# Patient Record
Sex: Female | Born: 1935 | Race: White | Hispanic: No | Marital: Married | Smoking: Never smoker
Health system: Southern US, Community
[De-identification: ages and names within clinical notes are randomized; demographics above are authoritative.]

## PROBLEM LIST (undated history)

## (undated) DIAGNOSIS — I509 Heart failure, unspecified: Secondary | ICD-10-CM

## (undated) DIAGNOSIS — C679 Malignant neoplasm of bladder, unspecified: Secondary | ICD-10-CM

## (undated) DIAGNOSIS — I495 Sick sinus syndrome: Secondary | ICD-10-CM

## (undated) DIAGNOSIS — Z951 Presence of aortocoronary bypass graft: Secondary | ICD-10-CM

## (undated) DIAGNOSIS — Z950 Presence of cardiac pacemaker: Secondary | ICD-10-CM

## (undated) DIAGNOSIS — R55 Syncope and collapse: Secondary | ICD-10-CM

## (undated) DIAGNOSIS — I219 Acute myocardial infarction, unspecified: Secondary | ICD-10-CM

## (undated) DIAGNOSIS — I639 Cerebral infarction, unspecified: Secondary | ICD-10-CM

## (undated) DIAGNOSIS — I10 Essential (primary) hypertension: Secondary | ICD-10-CM

## (undated) DIAGNOSIS — Z5189 Encounter for other specified aftercare: Secondary | ICD-10-CM

## (undated) DIAGNOSIS — H269 Unspecified cataract: Secondary | ICD-10-CM

## (undated) DIAGNOSIS — I2510 Atherosclerotic heart disease of native coronary artery without angina pectoris: Secondary | ICD-10-CM

## (undated) DIAGNOSIS — I480 Paroxysmal atrial fibrillation: Secondary | ICD-10-CM

## (undated) DIAGNOSIS — G459 Transient cerebral ischemic attack, unspecified: Secondary | ICD-10-CM

## (undated) DIAGNOSIS — E119 Type 2 diabetes mellitus without complications: Secondary | ICD-10-CM

## (undated) DIAGNOSIS — F419 Anxiety disorder, unspecified: Secondary | ICD-10-CM

## (undated) DIAGNOSIS — E785 Hyperlipidemia, unspecified: Secondary | ICD-10-CM

## (undated) DIAGNOSIS — N183 Chronic kidney disease, stage 3 (moderate): Secondary | ICD-10-CM

## (undated) DIAGNOSIS — M810 Age-related osteoporosis without current pathological fracture: Secondary | ICD-10-CM

## (undated) DIAGNOSIS — E079 Disorder of thyroid, unspecified: Secondary | ICD-10-CM

## (undated) HISTORY — PX: TRANSURETHRAL RESECTION OF BLADDER: SUR1395

## (undated) HISTORY — DX: Disorder of thyroid, unspecified: E07.9

## (undated) HISTORY — PX: NEPHROURETERECTOMY: SUR876

## (undated) HISTORY — DX: Anxiety disorder, unspecified: F41.9

## (undated) HISTORY — PX: IR RETROGRADE PYELOGRAM: IMG2374

## (undated) HISTORY — PX: CAROTID ENDARTERECTOMY: SUR193

## (undated) HISTORY — DX: Encounter for other specified aftercare: Z51.89

## (undated) HISTORY — PX: CORONARY ARTERY BYPASS GRAFT: SHX141

## (undated) HISTORY — DX: Cerebral infarction, unspecified: I63.9

## (undated) HISTORY — DX: Acute myocardial infarction, unspecified: I21.9

## (undated) HISTORY — DX: Paroxysmal atrial fibrillation: I48.0

## (undated) HISTORY — DX: Heart failure, unspecified: I50.9

## (undated) HISTORY — DX: Malignant neoplasm of bladder, unspecified: C67.9

## (undated) HISTORY — DX: Unspecified cataract: H26.9

## (undated) HISTORY — DX: Transient cerebral ischemic attack, unspecified: G45.9

## (undated) HISTORY — DX: Presence of aortocoronary bypass graft: Z95.1

## (undated) HISTORY — DX: Type 2 diabetes mellitus without complications: E11.9

## (undated) HISTORY — DX: Age-related osteoporosis without current pathological fracture: M81.0

## (undated) HISTORY — DX: Sick sinus syndrome: I49.5

## (undated) HISTORY — DX: Hyperlipidemia, unspecified: E78.5

## (undated) HISTORY — PX: EYE SURGERY: SHX253

## (undated) HISTORY — DX: Chronic kidney disease, stage 3 (moderate): N18.3

## (undated) HISTORY — DX: Essential (primary) hypertension: I10

## (undated) HISTORY — DX: Atherosclerotic heart disease of native coronary artery without angina pectoris: I25.10

## (undated) HISTORY — PX: BLADDER SURGERY: SHX569

## (undated) HISTORY — PX: BACK SURGERY: SHX140

## (undated) HISTORY — PX: INSERT / REPLACE / REMOVE PACEMAKER: SUR710

## (undated) HISTORY — DX: Presence of cardiac pacemaker: Z95.0

---
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
## 2000-05-03 ENCOUNTER — Encounter: Payer: Self-pay | Admitting: Family Medicine

## 2000-05-03 ENCOUNTER — Encounter: Admission: RE | Admit: 2000-05-03 | Discharge: 2000-05-03 | Payer: Self-pay | Admitting: Family Medicine

## 2001-06-27 ENCOUNTER — Encounter: Payer: Self-pay | Admitting: Family Medicine

## 2001-06-27 ENCOUNTER — Encounter: Admission: RE | Admit: 2001-06-27 | Discharge: 2001-06-27 | Payer: Self-pay | Admitting: Family Medicine

## 2001-07-01 ENCOUNTER — Other Ambulatory Visit: Admission: RE | Admit: 2001-07-01 | Discharge: 2001-07-01 | Payer: Self-pay | Admitting: Family Medicine

## 2006-10-21 ENCOUNTER — Ambulatory Visit: Payer: Self-pay | Admitting: Vascular Surgery

## 2006-10-24 ENCOUNTER — Inpatient Hospital Stay (HOSPITAL_COMMUNITY): Admission: RE | Admit: 2006-10-24 | Discharge: 2006-10-25 | Payer: Self-pay | Admitting: Vascular Surgery

## 2006-10-24 ENCOUNTER — Ambulatory Visit: Payer: Self-pay | Admitting: Vascular Surgery

## 2006-10-24 ENCOUNTER — Encounter (INDEPENDENT_AMBULATORY_CARE_PROVIDER_SITE_OTHER): Payer: Self-pay | Admitting: Specialist

## 2006-11-11 ENCOUNTER — Ambulatory Visit: Payer: Self-pay | Admitting: Vascular Surgery

## 2007-05-16 ENCOUNTER — Ambulatory Visit: Payer: Self-pay | Admitting: Vascular Surgery

## 2007-06-02 ENCOUNTER — Inpatient Hospital Stay (HOSPITAL_COMMUNITY): Admission: RE | Admit: 2007-06-02 | Discharge: 2007-06-03 | Payer: Self-pay | Admitting: Vascular Surgery

## 2007-06-02 ENCOUNTER — Ambulatory Visit: Payer: Self-pay | Admitting: Vascular Surgery

## 2007-06-02 ENCOUNTER — Encounter: Payer: Self-pay | Admitting: Vascular Surgery

## 2007-06-13 ENCOUNTER — Ambulatory Visit: Payer: Self-pay | Admitting: Vascular Surgery

## 2007-11-28 ENCOUNTER — Ambulatory Visit: Payer: Self-pay | Admitting: Vascular Surgery

## 2008-10-26 ENCOUNTER — Ambulatory Visit: Payer: Self-pay | Admitting: Thoracic Surgery (Cardiothoracic Vascular Surgery)

## 2008-12-08 ENCOUNTER — Ambulatory Visit: Payer: Self-pay | Admitting: Thoracic Surgery (Cardiothoracic Vascular Surgery)

## 2010-11-07 NOTE — Procedures (Signed)
CAROTID DUPLEX EXAM   INDICATION:  Follow up known carotid artery disease.   HISTORY:  Diabetes:  No.  Cardiac:  No.  Hypertension:  No.  Smoking:  No.  Previous Surgery:  CV History:  Amaurosis Fugax  No, Paresthesias No , Hemiparesis No                                       RIGHT             LEFT  Brachial systolic pressure:         110               120  Brachial Doppler waveforms:         Biphasic          Biphasic  Vertebral direction of flow:        Not well visualized                 Antegrade  DUPLEX VELOCITIES (cm/sec)  CCA peak systolic                   108               98  ECA peak systolic                   166               264  ICA peak systolic                   69                448  ICA end diastolic                   23                137  PLAQUE MORPHOLOGY:                  None              Heterogenous  PLAQUE AMOUNT:                      None              Severe  PLAQUE LOCATION:                    ECA               ICA, ECA   IMPRESSION:  1. 80-99% stenosis noted in the left internal carotid artery.  2. Normal carotid duplex noted in the right internal carotid artery.      Status post right carotid endarterectomy.  3. Antegrade left vertebral artery.   ___________________________________________  Larina Earthly, M.D.   MG/MEDQ  D:  05/16/2007  T:  05/17/2007  Job:  [id number]

## 2010-11-07 NOTE — Op Note (Signed)
Cassandra Castro, Cassandra Castro                 ACCOUNT NO.:  [id number]   MEDICAL RECORD NO.:  [id number]          PATIENT TYPE:  INP   LOCATION:  2899                         FACILITY:  [facility]   PHYSICIAN:  Larina Earthly, M.D.    DATE OF BIRTH:  03/23/1934   DATE OF PROCEDURE:  06/02/2007  DATE OF DISCHARGE:                               OPERATIVE REPORT   PREOPERATIVE DIAGNOSIS:  Severe asymptomatic left internal carotid  artery stenosis.   POSTOPERATIVE DIAGNOSIS:  Severe asymptomatic left internal carotid  artery stenosis.   PROCEDURE:  Left carotid endarterectomy with Dacron patch angioplasty.   SURGEON:  Larina Earthly, M.D.   ASSISTANT:  [name], PA-C.   ANESTHESIA:  General endotracheal.   COMPLICATIONS:  None.   DISPOSITION:  To recovery room, stable.   PROCEDURE IN DETAIL:  Patient was taken to the operating room and placed  supine.  The area over the left neck was prepped and draped in the usual  sterile fashion.  An incision was made anteriorly in the  sternocleidomastoid and carried through the tissue with electrocautery.  The sternocleidomastoid was reflected posteriorly, and the carotid  sheath was opened.  The facial vein was ligated with 2-0 silk ties and  divided.  The vagus and hypoglossal nerves were identified and  preserved.  Dissection was continued around the common carotid artery,  which was encircled with umbilical tape and Rumel tourniquet.  Dissection was continued on to the bifurcation.  The superior thyroid  artery was circled with a 2-0 silk and Pott's tie.  The external carotid  artery was circled with a blue vessel loop, and the internal carotid  artery was circled with umbilical tape and Rumel tourniquet.  The  patient was given 7000 units of intravenous heparin.  After adequate  circulation time, the internal, external, and common carotid arteries  were occluded.  The common carotid artery was opened with an 11 blade,  __________ into the  internal carotid artery.  A 10 shunt was passed up  the internal carotid, allowed to backbleed, and down the common carotid  artery, where it was secured with Rumel tourniquets.  The endarterectomy  was begun on the common carotid artery, and the plaque was divided  proximally with Pott's scissors.  The endarterectomy was extended onto  the bifurcation.  The external carotid was endarterectomized with an  eversion technique, and the internal carotid artery was  endarterectomized in an open fashion.  The remaining atheromatous debris  was removed from within the endarterectomy plane.  A Finesse Hemashield  Dacron patch was brought into the field and was sewn as a patch  angioplasty with a running 6-0 Prolene suture.  Prior to completion of  the anastomosis, the usual flushing maneuvers were undertaken, and the  shunt was removed.  The anastomosis was completed, and flow was restored  first to the external, then the internal carotid artery.  Excellent flow  characteristics were noted with hand-held Doppler in the internal and  external carotid arteries.  Patient was given 50 mg of protamine to  reverse heparin.  The wounds were irrigated with saline.  Hemostasis:  Electrocautery wounds were closed with 3-0 Vicryl.  We reapproximated  the sternocleidomastoid over  the carotid sheath.  Next, the platysma was closed with a running 3-0  Vicryl suture, and finally, the skin was closed with a 4-0 subcuticular  Vicryl stitch.  Sterile dressing was applied.  Patient was awakened in  the operating room neurologically intact and was transferred to the  recovery room in stable condition.      Larina Earthly, M.D.  Electronically Signed     TFE/MEDQ  D:  06/02/2007  T:  06/02/2007  Job:  [id number]

## 2010-11-07 NOTE — Procedures (Signed)
CAROTID DUPLEX EXAM   INDICATION:  Follow-up evaluation, status post bilateral carotid  endarterectomy.   HISTORY:  Diabetes:  No.  Cardiac:  No.  Hypertension:  No.  Smoking:  No.  Previous Surgery:  Right carotid endarterectomy with Dacron patch  angioplasty on 10/24/06.  Left carotid endarterectomy with Dacron patch  angioplasty on 06/02/07.  Both surgeries were performed by Dr. Arbie Cookey.  CV History:  Previous duplex (preop) on 05/16/07 revealed no right ICA  stenosis, status post endarterectomy and an 80-99% left ICA stenosis.  Amaurosis Fugax No, Paresthesias No, Hemiparesis No.  Patient reports  floater in the right eye for five days.                                       RIGHT             LEFT  Brachial systolic pressure:         136               140  Brachial Doppler waveforms:         Triphasic         Triphasic  Vertebral direction of flow:        Inaudible         Antegrade  DUPLEX VELOCITIES (cm/sec)  CCA peak systolic                   56                74  ECA peak systolic                   84                169  ICA peak systolic                   50                78  ICA end diastolic                   10                25  PLAQUE MORPHOLOGY:                  None              None  PLAQUE AMOUNT:                      None              None  PLAQUE LOCATION:                    None              None   IMPRESSION:  20-39% internal carotid artery stenosis, status post  endarterectomy bilaterally.   ___________________________________________  Larina Earthly, M.D.   MC/MEDQ  D:  11/28/2007  T:  11/28/2007  Job:  [id number]

## 2010-11-07 NOTE — Assessment & Plan Note (Signed)
OFFICE VISIT   Cassandra Castro, Cassandra Castro  DOB:  [birth year]                                       06/13/2007  AVWUJ#:[id number]   The patient presents today for follow up of her left carotid  endarterectomy on 12/08.  She did well and was discharged home on  postoperative day 1.  She continues to do well since her surgery.  She  does report 1 episode approximately 5 days following her surgery where  she sensed a rapid heart rate in the 105 to 110 range.  This has  resolved.  She has had no neurological deficit and no wound problems.  Her neck incision is well healed and she has no carotid bruits  bilaterally.  She is neurologically intact.  I am quite pleased with her  initial recovery.  She will return to her usual activities and we will  see her again in 6 months for a repeat carotid duplex.   Larina Earthly, M.D.  Electronically Signed   TFE/MEDQ  D:  06/13/2007  T:  06/16/2007  Job:  [id number]   cc:   Lacretia Leigh. Quintella Reichert, M.D.

## 2010-11-07 NOTE — H&P (Signed)
HISTORY AND PHYSICAL EXAMINATION   May 16, 2007   Re:  Cassandra Castro, Cassandra Castro                   DOB:  [birth year]   ADMISSION DIAGNOSIS:  Severe asymptomatic left internal carotid artery  stenosis.   HISTORY OF PRESENT ILLNESS:  Patient is a 77 year old white female  status post right carotid endarterectomy for asymptomatic carotid  disease on Oct 24, 2006.  She has had no difficulty since that time.  Specifically denied any amaurosis fugax, transient ischemic attack, or  stroke.  She was seen in my office for continued followup of known  asymptomatic disease on the left and has had marked progression to  severe stenosis in her left carotid system.   PAST MEDICAL HISTORY:  Elevated cholesterol, history of bladder cancer  with no recurrence.   SOCIAL HISTORY:  She is married with five children.  Does not smoke or  drink alcohol on a regular basis.   MEDICATIONS:  Pravachol, aspirin, Valium p.r.n., Altace, simvastatin.   PHYSICAL EXAMINATION:  A well-developed and well-nourished white female  appearing her stated age of 77.  Blood pressure is 130/62, pulse 64,  respirations 18.  She is grossly intact neurologically.  She does have  bilateral carotid bruits and a well-healed right carotid incision.  Radial pulses are 2+.  She has 2+ femoral pulses.  Heart:  Regular rate  and rhythm.  Abdomen is soft and nontender.  No masses.  Chest:  Clear  bilaterally.   IMPRESSION:  Severe asymptomatic left internal carotid artery stenosis  with progression by duplex examination.   PLAN:  Patient will be admitted on June 02, 2007 for left carotid  endarterectomy and Dacron patch angioplasty.  She understands the  procedure, including the potential of 1-2% risk of stroke with surgery.   Cassandra Castro, M.D.  Electronically Signed   TFE/MEDQ  D:  05/16/2007  T:  05/19/2007  Job:  [id number]   cc:   Quentin Mulling, MD

## 2010-11-07 NOTE — Discharge Summary (Signed)
Cassandra Castro, Cassandra Castro                 ACCOUNT NO.:  [id number]   MEDICAL RECORD NO.:  [id number]          PATIENT TYPE:  INP   LOCATION:  3303                         FACILITY:  [facility]   PHYSICIAN:  Cassandra Castro, M.D.    DATE OF BIRTH:  1934/06/20   DATE OF ADMISSION:  06/02/2007  DATE OF DISCHARGE:  06/03/2007                               DISCHARGE SUMMARY   ADMISSION DIAGNOSIS:  Severe left internal carotid artery stenosis,  asymptomatic.   DISCHARGE/SECONDARY DIAGNOSES:  1. Severe left internal carotid artery stenosis, asymptomatic status      post left carotid endarterectomy.  2. Postoperative hypotension, improved.  3. Hypokalemia, supplemented.  4. Mild postoperative acute blood loss anemia.  5. History of right carotid endarterectomy in May 2008.  6. Hypercholesterolemia.  7. History of bladder cancer with no recurrence.  8. History of atrial fibrillation.   ALLERGIES:  AUGMENTIN, SULFA, PHENERGAN.   PROCEDURES:  June 02, 2007:  Left carotid endarterectomy with Dacron  patch angioplasty by Dr. Gretta Began.   HISTORY:  Cassandra Castro is a 77 year old Caucasian female with history of  __________  status post right carotid endarterectomy for asymptomatic  disease on Oct 24, 2006.  She had no difficulty since that time.  She  specifically denied history of amaurosis fugax, transient ischemic  attack, or stroke.  She has continued to see Dr. Arbie Cookey at the [facility] office  for continued followup for left carotid stenosis and recently was found  to have marked progression on the left.  He recommended that she undergo  elective left carotid endarterectomy to reduce her risk for future  stroke.   HOSPITAL COURSE:  Cassandra Castro was electively admitted to [facility] on 06/02/2007.  She underwent the previously mentioned  procedure.  After a short stay in recovery unit, was transferred to step-  down unit, 3300, where she remained until discharge.  She had a  relatively uneventful  hospital course, although required dopamine and  fluid boluses for postoperative hypotension which had improved by  discharge.  She also required supplementation for hypokalemia with a  potassium of 3 at discharge.  Other labs showed sodium 132, chloride  102, CO2 25, blood glucose 96, BUN 9, creatinine 0.95, calcium 7.2,  white blood count 5.9, hemoglobin 9, hematocrit 25, and platelet count  142,000.  Morning of postoperative day 1, her vitals were stable.  Neurologically was intact.  Incision showed no evidence of hematoma.  Her diet was advanced.  She was able to void following Foley catheter  removal and mobilized without difficulty.  Pain was controlled on oral  medication and she was subsequently felt appropriate for discharge home  on postoperative day 1, June 03, 2007.  She was in stable condition  at discharge.   DISCHARGE MEDICATIONS:  1. Tylox 1-2 tablets p.o. q.6 hours p.r.n. pain.  2. Aspirin 81 mg p.o. daily.  3. Valium 10 mg p.o. nightly.  4. Multivitamin 1 p.o. daily.  5. Niacin 500 mg p.o. nightly.  6. Altace 2.5 mg p.o. daily.  7. Simvastatin 20 mg p.o. nightly.  DISCHARGE INSTRUCTIONS:  1. She may continue a heart-healthy diet.  2. May shower, clean her incisions in only soap and water.  3. Call if she develops fever greater than 101, redness or drainage      from her incision site, changes in her neurological status.  4. She is to avoid driving or heavy lifting for the next 2 weeks.  5. Can increase her activity as tolerated.  6. She is to follow up with Dr. Arbie Cookey at the [facility] office in      approximately 2 weeks.  Our office will contact her regarding      specific appointment date and time.      Cassandra Castro, P.A.      Cassandra Castro, M.D.  Electronically Signed    AWZ/MEDQ  D:  06/03/2007  T:  06/03/2007  Job:  [id number]   cc:   Tinnie Gens, M.D. [place]

## 2010-11-07 NOTE — Assessment & Plan Note (Signed)
OFFICE VISIT   Cassandra Castro, Cassandra Castro  DOB:  28-Aug-1933                                       11/28/2007  EAVWU#:[id number]   The patient presents today for continued followup of her staged  bilateral endarterectomies.  She is her usual animated self with no new  difficulties.  She specifically denies any amaurosis fugax, transient  ischemic attack, or stroke.  She is grossly intact neurologically.  Her  right endarterectomy was in May 2008 with left endarterectomy was  December 2008.   PHYSICAL EXAM:  Her carotid arteries are without bruits bilaterally.  She had a well-healed carotid incisions bilaterally without bruits.  She  had 2+ radial pulses bilaterally.  She underwent repeat duplex today and  this reveals widely patent endarterectomies bilaterally with no evidence  of stenosis.  I am quite pleased with her initial followup.  We will  continue to see her on a yearly basis with carotid duplex to rule out  intercurrent stenosis.  She will notify us should she develop any  neurologic deficits.   Larina Earthly, M.D.  Electronically Signed   TFE/MEDQ  D:  11/28/2007  T:  12/01/2007  Job:  [id number]   cc:   Quentin Mulling, MD

## 2010-11-10 NOTE — Discharge Summary (Signed)
Cassandra Castro, Cassandra Castro NO.:  [id number]   MEDICAL RECORD NO.:  [id number]          PATIENT TYPE:  INP   LOCATION:  3308                         FACILITY:  [facility]   PHYSICIAN:  Larina Earthly, M.D.    DATE OF BIRTH:  [birth year]   DATE OF ADMISSION:  10/24/2006  DATE OF DISCHARGE:  10/25/2006                               DISCHARGE SUMMARY   HISTORY OF THE PRESENT ILLNESS:  Ms. Bastone is a 77 year old female  followed by Dr. Arbie Cookey for severe asymptomatic right internal carotid  artery stenosis.  She has remained stable and was admitted this  hospitalization for elective carotid endarterectomy.   PAST MEDICAL HISTORY:  Does include the following:  1. Hypercholesterolemia.  2. History of bladder cancer without recurrence.  3. History of atrial fibrillation.   MEDICATIONS PRIOR TO ADMISSION:  1. Pravachol  2. Daily aspirin.  3. Valium 10 mg p.r.n. as needed for anxiety.   ALLERGIES:  Include AUGMENTIN and SULFA.   For family history, social history, review of systems and physical exam,  please see the history and physical done at the time of admission.   HOSPITAL COURSE:  The patient was admitted electively and taken to the  operating room on Oct 24, 2006, at which time she underwent a right  carotid endarterectomy.  She tolerated this procedure well and was taken  to the postanesthesia care unit in stable condition.   POSTOPERATIVE HOSPITAL COURSE:  The patient did quite well.  She  remained neurologically intact.  Incision was fine without evidence of  bleeding, hematoma or infection.  She progressed in a routine manner  regarding advancement in activities.  Her overall status was felt to be  acceptable for discharge on postoperative day #1.   MEDICATIONS ON DISCHARGE:  1. Multivitamin one daily.  2. Aspirin 81 mg daily.  3. Valium p.r.n. as needed previously.  4. Pravachol 20 mg nightly.  5. Lasix 40 mg daily for pain.  6. Tylox one q.4-6 h. as  needed.   FOLLOWUP:  Dr. Arbie Cookey on Nov 11, 2006 at 10 a.m.   FINAL DIAGNOSES:  1. Severe asymptomatic right internal carotid artery stenosis, now      status post carotid endarterectomy as described above.  2. Other diagnoses as previously listed per the history.      Rowe Clack, P.A.-C.      Larina Earthly, M.D.  Electronically Signed    WEG/MEDQ  D:  12/18/2006  T:  12/19/2006  Job:  [id number]

## 2010-11-10 NOTE — Op Note (Signed)
Cassandra Castro, Cassandra Castro                 ACCOUNT NO.:  [id number]   MEDICAL RECORD NO.:  [id number]          PATIENT TYPE:  INP   LOCATION:  2550                         FACILITY:  [facility]   PHYSICIAN:  Larina Earthly, M.D.    DATE OF BIRTH:  04-27-1934   DATE OF PROCEDURE:  10/24/2006  DATE OF DISCHARGE:                               OPERATIVE REPORT   PREOPERATIVE DIAGNOSIS:  Severe asymptomatic right internal carotid  stenosis.   POSTOPERATIVE DIAGNOSIS:  Severe asymptomatic right internal carotid  stenosis.   PROCEDURE:  Right carotid endarterectomy and Dacron patch angioplasty.   SURGEON:  Larina Earthly, M.D.   ASSISTANT:  Nurse.   ANESTHESIA:  General endotracheal.   COMPLICATIONS:  None.   DISPOSITION:  To recovery room neurologically intact.   DESCRIPTION OF PROCEDURE:  The patient was taken to the operating room  and placed in the supine position where the area of the right neck  prepped and draped in usual sterile fashion.  Incision was made anterior  sternocleidomastoid and carried down to the platysma with  electrocautery.  Sternocleidomastoid was reflected posteriorly and the  carotid sheath was opened.  The vagus and hypoglossal nerves were  identified and preserved.  The common carotid artery was encircled with  umbilical tape and Rumel tourniquet.  Dissection was extended on to the  bifurcation.  The superior thyroid artery was circled 2-0 silk Potts  tie.  The external carotid circled with blue Vesseloop.  The internal  carotid circled with umbilical tape and Rumel tourniquet.  The patient  was given 7000 units of intravenous heparin.  After adequate circulation  time, the internal, external and common carotid arteries were occluded.  The common carotid artery was opened with a 11 blade, extended  longitudinally with Potts scissors.  A 10 shunt was passed up the  internal carotid, allowed to back bleed, then down the common carotid  were it was secured with Rumel  tourniquets.  Endarterectomy was begun on  the common carotid artery and the plaques divided proximally with Potts  scissors.  Endarterectomy was extended on to bifurcation.  External  carotid was endarterectomized with eversion technique and the internal  carotid endarterectomized in open fashion.  Remaining atheromatous  debris was removed from the endarterectomy plane.  Finesse Hemashield  Dacron patch was brought into the field and sewn as a patch angioplasty  with a running 6-0 Prolene suture.  Prior to completion of the  anastomosis, the usual flushing maneuvers were undertaken.  Anastomosis  was then completed, the external followed by the common, finally the  internal carotid artery's occlusion clamp was removed.  Excellent flow  characteristics were noted with handheld Doppler in the internal and  external carotid arteries.  The patient was given 50 mg of protamine to  reverse the heparin.  Wounds were irrigated with saline.  Hemostasis  with electrocautery.  Wounds closed 3-0 Vicryl in the subcutaneous and  subcuticular tissue.  Benzoin and Steri-Strips were applied.      Larina Earthly, M.D.  Electronically Signed     TFE/MEDQ  D:  10/24/2006  T:  10/24/2006  Job:  [id number]

## 2011-04-02 LAB — CBC
HCT: 36.8
Hemoglobin: 12.8
Hemoglobin: 9 — ABNORMAL LOW
MCHC: 34.8
MCHC: 34.9
MCV: 89.3
MCV: 89.8
Platelets: 228
RBC: 4.13
RDW: 13.8
RDW: 14.1
WBC: 5.6

## 2011-04-02 LAB — BASIC METABOLIC PANEL
CO2: 25
Calcium: 7.2 — ABNORMAL LOW
Chloride: 102
Creatinine, Ser: 0.95
Glucose, Bld: 96

## 2011-04-02 LAB — COMPREHENSIVE METABOLIC PANEL
ALT: 22
BUN: 7
Calcium: 9.9
Creatinine, Ser: 0.73
Glucose, Bld: 95
Sodium: 138
Total Protein: 5.9 — ABNORMAL LOW

## 2011-04-02 LAB — PROTIME-INR
INR: 1
Prothrombin Time: 13.5

## 2011-04-02 LAB — TYPE AND SCREEN
ABO/RH(D): O POS
Antibody Screen: NEGATIVE

## 2011-04-02 LAB — URINALYSIS, ROUTINE W REFLEX MICROSCOPIC
Bilirubin Urine: NEGATIVE
Glucose, UA: NEGATIVE
Hgb urine dipstick: NEGATIVE
Protein, ur: NEGATIVE
Urobilinogen, UA: 0.2

## 2011-04-02 LAB — APTT: aPTT: 27

## 2012-12-24 ENCOUNTER — Telehealth: Payer: Self-pay

## 2012-12-24 NOTE — Telephone Encounter (Signed)
Pt's. Daughter called to report pt. had a severe headache yesterday.  At the time of the headache, it was noted she was a little confused.  Daughter states her mother has had "multiple health problems".  Denied that pt. C/o of any vision loss.  States has had some unsteadiness.  Speech clear.  Daughter thought pt. had a slight facial droop.  States pt's. confusion is better today, and pt. denies headache.  Today, states she took pt. in for urinalysis to rule-out UTI.   Advised daughter pt. may have had a stroke.  She should be evaluated either in the ER or by her PCP.  Encouraged daughter to call the PCP in the AM to have her evaluated. Advised her to go to the ER if symptoms worsen.  Daughter verb. understanding.

## 2015-01-26 ENCOUNTER — Other Ambulatory Visit: Payer: Self-pay

## 2015-02-14 DIAGNOSIS — Z951 Presence of aortocoronary bypass graft: Secondary | ICD-10-CM

## 2015-02-14 DIAGNOSIS — I10 Essential (primary) hypertension: Secondary | ICD-10-CM

## 2015-02-14 DIAGNOSIS — I480 Paroxysmal atrial fibrillation: Secondary | ICD-10-CM

## 2015-02-14 DIAGNOSIS — E785 Hyperlipidemia, unspecified: Secondary | ICD-10-CM

## 2015-02-14 DIAGNOSIS — I25119 Atherosclerotic heart disease of native coronary artery with unspecified angina pectoris: Secondary | ICD-10-CM | POA: Insufficient documentation

## 2015-02-14 DIAGNOSIS — E7800 Pure hypercholesterolemia, unspecified: Secondary | ICD-10-CM | POA: Insufficient documentation

## 2015-02-14 DIAGNOSIS — I2510 Atherosclerotic heart disease of native coronary artery without angina pectoris: Secondary | ICD-10-CM

## 2015-02-14 DIAGNOSIS — I119 Hypertensive heart disease without heart failure: Secondary | ICD-10-CM | POA: Insufficient documentation

## 2015-02-14 HISTORY — DX: Presence of aortocoronary bypass graft: Z95.1

## 2015-02-14 HISTORY — DX: Essential (primary) hypertension: I10

## 2015-02-14 HISTORY — DX: Hyperlipidemia, unspecified: E78.5

## 2015-02-14 HISTORY — DX: Atherosclerotic heart disease of native coronary artery without angina pectoris: I25.10

## 2015-02-14 HISTORY — DX: Paroxysmal atrial fibrillation: I48.0

## 2015-02-15 DIAGNOSIS — Z950 Presence of cardiac pacemaker: Secondary | ICD-10-CM | POA: Insufficient documentation

## 2015-02-15 HISTORY — DX: Presence of cardiac pacemaker: Z95.0

## 2015-02-18 DIAGNOSIS — C679 Malignant neoplasm of bladder, unspecified: Secondary | ICD-10-CM | POA: Insufficient documentation

## 2015-02-18 DIAGNOSIS — F419 Anxiety disorder, unspecified: Secondary | ICD-10-CM

## 2015-02-18 HISTORY — DX: Anxiety disorder, unspecified: F41.9

## 2015-02-18 HISTORY — DX: Malignant neoplasm of bladder, unspecified: C67.9

## 2015-12-01 DIAGNOSIS — I495 Sick sinus syndrome: Secondary | ICD-10-CM

## 2015-12-01 HISTORY — DX: Sick sinus syndrome: I49.5

## 2016-04-22 DIAGNOSIS — N183 Chronic kidney disease, stage 3 unspecified: Secondary | ICD-10-CM

## 2016-04-22 HISTORY — DX: Chronic kidney disease, stage 3 unspecified: N18.30

## 2017-01-25 DIAGNOSIS — I480 Paroxysmal atrial fibrillation: Secondary | ICD-10-CM | POA: Diagnosis not present

## 2017-01-25 DIAGNOSIS — N390 Urinary tract infection, site not specified: Secondary | ICD-10-CM

## 2017-01-25 DIAGNOSIS — S42301A Unspecified fracture of shaft of humerus, right arm, initial encounter for closed fracture: Secondary | ICD-10-CM

## 2017-01-25 DIAGNOSIS — R4182 Altered mental status, unspecified: Secondary | ICD-10-CM

## 2017-01-25 DIAGNOSIS — I2510 Atherosclerotic heart disease of native coronary artery without angina pectoris: Secondary | ICD-10-CM | POA: Diagnosis not present

## 2017-01-25 DIAGNOSIS — I10 Essential (primary) hypertension: Secondary | ICD-10-CM

## 2017-01-26 DIAGNOSIS — N390 Urinary tract infection, site not specified: Secondary | ICD-10-CM | POA: Diagnosis not present

## 2017-01-26 DIAGNOSIS — I10 Essential (primary) hypertension: Secondary | ICD-10-CM | POA: Diagnosis not present

## 2017-01-26 DIAGNOSIS — I2510 Atherosclerotic heart disease of native coronary artery without angina pectoris: Secondary | ICD-10-CM | POA: Diagnosis not present

## 2017-01-26 DIAGNOSIS — I4891 Unspecified atrial fibrillation: Secondary | ICD-10-CM

## 2017-01-26 DIAGNOSIS — I480 Paroxysmal atrial fibrillation: Secondary | ICD-10-CM | POA: Diagnosis not present

## 2017-01-27 DIAGNOSIS — I480 Paroxysmal atrial fibrillation: Secondary | ICD-10-CM | POA: Diagnosis not present

## 2017-01-27 DIAGNOSIS — N390 Urinary tract infection, site not specified: Secondary | ICD-10-CM | POA: Diagnosis not present

## 2017-01-27 DIAGNOSIS — I2510 Atherosclerotic heart disease of native coronary artery without angina pectoris: Secondary | ICD-10-CM | POA: Diagnosis not present

## 2017-01-27 DIAGNOSIS — I10 Essential (primary) hypertension: Secondary | ICD-10-CM | POA: Diagnosis not present

## 2017-01-28 DIAGNOSIS — E669 Obesity, unspecified: Secondary | ICD-10-CM

## 2017-01-28 DIAGNOSIS — G9341 Metabolic encephalopathy: Secondary | ICD-10-CM

## 2017-01-28 DIAGNOSIS — E785 Hyperlipidemia, unspecified: Secondary | ICD-10-CM

## 2017-01-28 DIAGNOSIS — I10 Essential (primary) hypertension: Secondary | ICD-10-CM | POA: Diagnosis not present

## 2017-01-28 DIAGNOSIS — I480 Paroxysmal atrial fibrillation: Secondary | ICD-10-CM | POA: Diagnosis not present

## 2017-01-28 DIAGNOSIS — S42301A Unspecified fracture of shaft of humerus, right arm, initial encounter for closed fracture: Secondary | ICD-10-CM | POA: Diagnosis not present

## 2017-01-29 DIAGNOSIS — E669 Obesity, unspecified: Secondary | ICD-10-CM | POA: Diagnosis not present

## 2017-01-29 DIAGNOSIS — G9341 Metabolic encephalopathy: Secondary | ICD-10-CM | POA: Diagnosis not present

## 2017-01-29 DIAGNOSIS — E785 Hyperlipidemia, unspecified: Secondary | ICD-10-CM | POA: Diagnosis not present

## 2017-01-29 DIAGNOSIS — I10 Essential (primary) hypertension: Secondary | ICD-10-CM | POA: Diagnosis not present

## 2017-01-30 DIAGNOSIS — E785 Hyperlipidemia, unspecified: Secondary | ICD-10-CM | POA: Diagnosis not present

## 2017-01-30 DIAGNOSIS — E669 Obesity, unspecified: Secondary | ICD-10-CM | POA: Diagnosis not present

## 2017-01-30 DIAGNOSIS — I10 Essential (primary) hypertension: Secondary | ICD-10-CM | POA: Diagnosis not present

## 2017-01-30 DIAGNOSIS — G9341 Metabolic encephalopathy: Secondary | ICD-10-CM | POA: Diagnosis not present

## 2017-09-27 DIAGNOSIS — I110 Hypertensive heart disease with heart failure: Secondary | ICD-10-CM | POA: Diagnosis not present

## 2017-09-27 DIAGNOSIS — I509 Heart failure, unspecified: Secondary | ICD-10-CM | POA: Diagnosis not present

## 2017-09-27 DIAGNOSIS — R29898 Other symptoms and signs involving the musculoskeletal system: Secondary | ICD-10-CM | POA: Insufficient documentation

## 2017-09-27 DIAGNOSIS — Z7902 Long term (current) use of antithrombotics/antiplatelets: Secondary | ICD-10-CM | POA: Diagnosis not present

## 2017-09-27 DIAGNOSIS — M1991 Primary osteoarthritis, unspecified site: Secondary | ICD-10-CM | POA: Diagnosis not present

## 2017-09-27 DIAGNOSIS — F419 Anxiety disorder, unspecified: Secondary | ICD-10-CM | POA: Diagnosis not present

## 2017-09-27 DIAGNOSIS — G629 Polyneuropathy, unspecified: Secondary | ICD-10-CM | POA: Diagnosis not present

## 2017-09-27 DIAGNOSIS — I480 Paroxysmal atrial fibrillation: Secondary | ICD-10-CM | POA: Diagnosis not present

## 2017-09-27 DIAGNOSIS — Z794 Long term (current) use of insulin: Secondary | ICD-10-CM | POA: Diagnosis not present

## 2017-09-27 DIAGNOSIS — I252 Old myocardial infarction: Secondary | ICD-10-CM | POA: Diagnosis not present

## 2017-09-27 DIAGNOSIS — E039 Hypothyroidism, unspecified: Secondary | ICD-10-CM | POA: Diagnosis not present

## 2017-09-27 DIAGNOSIS — I69351 Hemiplegia and hemiparesis following cerebral infarction affecting right dominant side: Secondary | ICD-10-CM | POA: Diagnosis not present

## 2017-09-27 DIAGNOSIS — I2510 Atherosclerotic heart disease of native coronary artery without angina pectoris: Secondary | ICD-10-CM | POA: Diagnosis not present

## 2017-09-27 DIAGNOSIS — J45909 Unspecified asthma, uncomplicated: Secondary | ICD-10-CM | POA: Diagnosis not present

## 2017-09-28 DIAGNOSIS — E039 Hypothyroidism, unspecified: Secondary | ICD-10-CM | POA: Diagnosis not present

## 2017-09-28 DIAGNOSIS — Z7902 Long term (current) use of antithrombotics/antiplatelets: Secondary | ICD-10-CM | POA: Diagnosis not present

## 2017-09-28 DIAGNOSIS — I69351 Hemiplegia and hemiparesis following cerebral infarction affecting right dominant side: Secondary | ICD-10-CM | POA: Diagnosis not present

## 2017-09-28 DIAGNOSIS — G629 Polyneuropathy, unspecified: Secondary | ICD-10-CM | POA: Diagnosis not present

## 2017-09-28 DIAGNOSIS — I252 Old myocardial infarction: Secondary | ICD-10-CM | POA: Diagnosis not present

## 2017-09-28 DIAGNOSIS — I509 Heart failure, unspecified: Secondary | ICD-10-CM | POA: Diagnosis not present

## 2017-09-28 DIAGNOSIS — M1991 Primary osteoarthritis, unspecified site: Secondary | ICD-10-CM | POA: Diagnosis not present

## 2017-09-28 DIAGNOSIS — Z794 Long term (current) use of insulin: Secondary | ICD-10-CM | POA: Diagnosis not present

## 2017-09-28 DIAGNOSIS — J45909 Unspecified asthma, uncomplicated: Secondary | ICD-10-CM | POA: Diagnosis not present

## 2017-09-28 DIAGNOSIS — I2510 Atherosclerotic heart disease of native coronary artery without angina pectoris: Secondary | ICD-10-CM | POA: Diagnosis not present

## 2017-09-28 DIAGNOSIS — F419 Anxiety disorder, unspecified: Secondary | ICD-10-CM | POA: Diagnosis not present

## 2017-09-28 DIAGNOSIS — I480 Paroxysmal atrial fibrillation: Secondary | ICD-10-CM | POA: Diagnosis not present

## 2017-09-28 DIAGNOSIS — I110 Hypertensive heart disease with heart failure: Secondary | ICD-10-CM | POA: Diagnosis not present

## 2017-10-01 DIAGNOSIS — F419 Anxiety disorder, unspecified: Secondary | ICD-10-CM | POA: Diagnosis not present

## 2017-10-01 DIAGNOSIS — I252 Old myocardial infarction: Secondary | ICD-10-CM | POA: Diagnosis not present

## 2017-10-01 DIAGNOSIS — Z794 Long term (current) use of insulin: Secondary | ICD-10-CM | POA: Diagnosis not present

## 2017-10-01 DIAGNOSIS — I480 Paroxysmal atrial fibrillation: Secondary | ICD-10-CM | POA: Diagnosis not present

## 2017-10-01 DIAGNOSIS — R531 Weakness: Secondary | ICD-10-CM | POA: Diagnosis not present

## 2017-10-01 DIAGNOSIS — G629 Polyneuropathy, unspecified: Secondary | ICD-10-CM | POA: Diagnosis not present

## 2017-10-01 DIAGNOSIS — I110 Hypertensive heart disease with heart failure: Secondary | ICD-10-CM | POA: Diagnosis not present

## 2017-10-01 DIAGNOSIS — I2510 Atherosclerotic heart disease of native coronary artery without angina pectoris: Secondary | ICD-10-CM | POA: Diagnosis not present

## 2017-10-01 DIAGNOSIS — Z7902 Long term (current) use of antithrombotics/antiplatelets: Secondary | ICD-10-CM | POA: Diagnosis not present

## 2017-10-01 DIAGNOSIS — J45909 Unspecified asthma, uncomplicated: Secondary | ICD-10-CM | POA: Diagnosis not present

## 2017-10-01 DIAGNOSIS — I69351 Hemiplegia and hemiparesis following cerebral infarction affecting right dominant side: Secondary | ICD-10-CM | POA: Diagnosis not present

## 2017-10-01 DIAGNOSIS — I509 Heart failure, unspecified: Secondary | ICD-10-CM | POA: Diagnosis not present

## 2017-10-01 DIAGNOSIS — E039 Hypothyroidism, unspecified: Secondary | ICD-10-CM | POA: Diagnosis not present

## 2017-10-01 DIAGNOSIS — M1991 Primary osteoarthritis, unspecified site: Secondary | ICD-10-CM | POA: Diagnosis not present

## 2017-10-08 DIAGNOSIS — I252 Old myocardial infarction: Secondary | ICD-10-CM | POA: Diagnosis not present

## 2017-10-08 DIAGNOSIS — I110 Hypertensive heart disease with heart failure: Secondary | ICD-10-CM | POA: Diagnosis not present

## 2017-10-08 DIAGNOSIS — Z7902 Long term (current) use of antithrombotics/antiplatelets: Secondary | ICD-10-CM | POA: Diagnosis not present

## 2017-10-08 DIAGNOSIS — Z794 Long term (current) use of insulin: Secondary | ICD-10-CM | POA: Diagnosis not present

## 2017-10-08 DIAGNOSIS — I69351 Hemiplegia and hemiparesis following cerebral infarction affecting right dominant side: Secondary | ICD-10-CM | POA: Diagnosis not present

## 2017-10-08 DIAGNOSIS — I2510 Atherosclerotic heart disease of native coronary artery without angina pectoris: Secondary | ICD-10-CM | POA: Diagnosis not present

## 2017-10-08 DIAGNOSIS — E039 Hypothyroidism, unspecified: Secondary | ICD-10-CM | POA: Diagnosis not present

## 2017-10-08 DIAGNOSIS — M1991 Primary osteoarthritis, unspecified site: Secondary | ICD-10-CM | POA: Diagnosis not present

## 2017-10-08 DIAGNOSIS — J45909 Unspecified asthma, uncomplicated: Secondary | ICD-10-CM | POA: Diagnosis not present

## 2017-10-08 DIAGNOSIS — I480 Paroxysmal atrial fibrillation: Secondary | ICD-10-CM | POA: Diagnosis not present

## 2017-10-08 DIAGNOSIS — F419 Anxiety disorder, unspecified: Secondary | ICD-10-CM | POA: Diagnosis not present

## 2017-10-08 DIAGNOSIS — I509 Heart failure, unspecified: Secondary | ICD-10-CM | POA: Diagnosis not present

## 2017-10-08 DIAGNOSIS — G629 Polyneuropathy, unspecified: Secondary | ICD-10-CM | POA: Diagnosis not present

## 2017-10-15 DIAGNOSIS — I110 Hypertensive heart disease with heart failure: Secondary | ICD-10-CM | POA: Diagnosis not present

## 2017-10-15 DIAGNOSIS — I480 Paroxysmal atrial fibrillation: Secondary | ICD-10-CM | POA: Diagnosis not present

## 2017-10-15 DIAGNOSIS — E039 Hypothyroidism, unspecified: Secondary | ICD-10-CM | POA: Diagnosis not present

## 2017-10-15 DIAGNOSIS — M1991 Primary osteoarthritis, unspecified site: Secondary | ICD-10-CM | POA: Diagnosis not present

## 2017-10-15 DIAGNOSIS — J45909 Unspecified asthma, uncomplicated: Secondary | ICD-10-CM | POA: Diagnosis not present

## 2017-10-15 DIAGNOSIS — I2510 Atherosclerotic heart disease of native coronary artery without angina pectoris: Secondary | ICD-10-CM | POA: Diagnosis not present

## 2017-10-15 DIAGNOSIS — I69351 Hemiplegia and hemiparesis following cerebral infarction affecting right dominant side: Secondary | ICD-10-CM | POA: Diagnosis not present

## 2017-10-15 DIAGNOSIS — G629 Polyneuropathy, unspecified: Secondary | ICD-10-CM | POA: Diagnosis not present

## 2017-10-15 DIAGNOSIS — Z794 Long term (current) use of insulin: Secondary | ICD-10-CM | POA: Diagnosis not present

## 2017-10-15 DIAGNOSIS — I252 Old myocardial infarction: Secondary | ICD-10-CM | POA: Diagnosis not present

## 2017-10-15 DIAGNOSIS — Z7902 Long term (current) use of antithrombotics/antiplatelets: Secondary | ICD-10-CM | POA: Diagnosis not present

## 2017-10-15 DIAGNOSIS — I509 Heart failure, unspecified: Secondary | ICD-10-CM | POA: Diagnosis not present

## 2017-10-15 DIAGNOSIS — F419 Anxiety disorder, unspecified: Secondary | ICD-10-CM | POA: Diagnosis not present

## 2017-10-23 DIAGNOSIS — F419 Anxiety disorder, unspecified: Secondary | ICD-10-CM | POA: Diagnosis not present

## 2017-10-23 DIAGNOSIS — I110 Hypertensive heart disease with heart failure: Secondary | ICD-10-CM | POA: Diagnosis not present

## 2017-10-23 DIAGNOSIS — E039 Hypothyroidism, unspecified: Secondary | ICD-10-CM | POA: Diagnosis not present

## 2017-10-23 DIAGNOSIS — I480 Paroxysmal atrial fibrillation: Secondary | ICD-10-CM | POA: Diagnosis not present

## 2017-10-23 DIAGNOSIS — I69351 Hemiplegia and hemiparesis following cerebral infarction affecting right dominant side: Secondary | ICD-10-CM | POA: Diagnosis not present

## 2017-10-23 DIAGNOSIS — Z7902 Long term (current) use of antithrombotics/antiplatelets: Secondary | ICD-10-CM | POA: Diagnosis not present

## 2017-10-23 DIAGNOSIS — J45909 Unspecified asthma, uncomplicated: Secondary | ICD-10-CM | POA: Diagnosis not present

## 2017-10-23 DIAGNOSIS — I509 Heart failure, unspecified: Secondary | ICD-10-CM | POA: Diagnosis not present

## 2017-10-23 DIAGNOSIS — I2510 Atherosclerotic heart disease of native coronary artery without angina pectoris: Secondary | ICD-10-CM | POA: Diagnosis not present

## 2017-10-23 DIAGNOSIS — G629 Polyneuropathy, unspecified: Secondary | ICD-10-CM | POA: Diagnosis not present

## 2017-10-23 DIAGNOSIS — M1991 Primary osteoarthritis, unspecified site: Secondary | ICD-10-CM | POA: Diagnosis not present

## 2017-10-23 DIAGNOSIS — I252 Old myocardial infarction: Secondary | ICD-10-CM | POA: Diagnosis not present

## 2017-10-23 DIAGNOSIS — Z794 Long term (current) use of insulin: Secondary | ICD-10-CM | POA: Diagnosis not present

## 2017-10-28 DIAGNOSIS — I69351 Hemiplegia and hemiparesis following cerebral infarction affecting right dominant side: Secondary | ICD-10-CM | POA: Diagnosis not present

## 2017-10-28 DIAGNOSIS — I480 Paroxysmal atrial fibrillation: Secondary | ICD-10-CM | POA: Diagnosis not present

## 2017-10-28 DIAGNOSIS — I110 Hypertensive heart disease with heart failure: Secondary | ICD-10-CM | POA: Diagnosis not present

## 2017-10-28 DIAGNOSIS — Z794 Long term (current) use of insulin: Secondary | ICD-10-CM | POA: Diagnosis not present

## 2017-10-28 DIAGNOSIS — I2510 Atherosclerotic heart disease of native coronary artery without angina pectoris: Secondary | ICD-10-CM | POA: Diagnosis not present

## 2017-10-28 DIAGNOSIS — M1991 Primary osteoarthritis, unspecified site: Secondary | ICD-10-CM | POA: Diagnosis not present

## 2017-10-28 DIAGNOSIS — I252 Old myocardial infarction: Secondary | ICD-10-CM | POA: Diagnosis not present

## 2017-10-28 DIAGNOSIS — E039 Hypothyroidism, unspecified: Secondary | ICD-10-CM | POA: Diagnosis not present

## 2017-10-28 DIAGNOSIS — G629 Polyneuropathy, unspecified: Secondary | ICD-10-CM | POA: Diagnosis not present

## 2017-10-28 DIAGNOSIS — J45909 Unspecified asthma, uncomplicated: Secondary | ICD-10-CM | POA: Diagnosis not present

## 2017-10-28 DIAGNOSIS — Z7902 Long term (current) use of antithrombotics/antiplatelets: Secondary | ICD-10-CM | POA: Diagnosis not present

## 2017-10-28 DIAGNOSIS — I509 Heart failure, unspecified: Secondary | ICD-10-CM | POA: Diagnosis not present

## 2017-10-28 DIAGNOSIS — F419 Anxiety disorder, unspecified: Secondary | ICD-10-CM | POA: Diagnosis not present

## 2017-10-30 DIAGNOSIS — R300 Dysuria: Secondary | ICD-10-CM | POA: Diagnosis not present

## 2017-10-30 DIAGNOSIS — I2510 Atherosclerotic heart disease of native coronary artery without angina pectoris: Secondary | ICD-10-CM | POA: Diagnosis not present

## 2017-10-30 DIAGNOSIS — I509 Heart failure, unspecified: Secondary | ICD-10-CM | POA: Diagnosis not present

## 2017-10-30 DIAGNOSIS — Z794 Long term (current) use of insulin: Secondary | ICD-10-CM | POA: Diagnosis not present

## 2017-10-30 DIAGNOSIS — I480 Paroxysmal atrial fibrillation: Secondary | ICD-10-CM | POA: Diagnosis not present

## 2017-10-30 DIAGNOSIS — Z7902 Long term (current) use of antithrombotics/antiplatelets: Secondary | ICD-10-CM | POA: Diagnosis not present

## 2017-10-30 DIAGNOSIS — I252 Old myocardial infarction: Secondary | ICD-10-CM | POA: Diagnosis not present

## 2017-10-30 DIAGNOSIS — F419 Anxiety disorder, unspecified: Secondary | ICD-10-CM | POA: Diagnosis not present

## 2017-10-30 DIAGNOSIS — I69351 Hemiplegia and hemiparesis following cerebral infarction affecting right dominant side: Secondary | ICD-10-CM | POA: Diagnosis not present

## 2017-10-30 DIAGNOSIS — E039 Hypothyroidism, unspecified: Secondary | ICD-10-CM | POA: Diagnosis not present

## 2017-10-30 DIAGNOSIS — G629 Polyneuropathy, unspecified: Secondary | ICD-10-CM | POA: Diagnosis not present

## 2017-10-30 DIAGNOSIS — M1991 Primary osteoarthritis, unspecified site: Secondary | ICD-10-CM | POA: Diagnosis not present

## 2017-10-30 DIAGNOSIS — J45909 Unspecified asthma, uncomplicated: Secondary | ICD-10-CM | POA: Diagnosis not present

## 2017-10-30 DIAGNOSIS — I110 Hypertensive heart disease with heart failure: Secondary | ICD-10-CM | POA: Diagnosis not present

## 2017-11-05 DIAGNOSIS — I509 Heart failure, unspecified: Secondary | ICD-10-CM | POA: Diagnosis not present

## 2017-11-05 DIAGNOSIS — I480 Paroxysmal atrial fibrillation: Secondary | ICD-10-CM | POA: Diagnosis not present

## 2017-11-05 DIAGNOSIS — I69351 Hemiplegia and hemiparesis following cerebral infarction affecting right dominant side: Secondary | ICD-10-CM | POA: Diagnosis not present

## 2017-11-05 DIAGNOSIS — Z7902 Long term (current) use of antithrombotics/antiplatelets: Secondary | ICD-10-CM | POA: Diagnosis not present

## 2017-11-05 DIAGNOSIS — I110 Hypertensive heart disease with heart failure: Secondary | ICD-10-CM | POA: Diagnosis not present

## 2017-11-05 DIAGNOSIS — I2510 Atherosclerotic heart disease of native coronary artery without angina pectoris: Secondary | ICD-10-CM | POA: Diagnosis not present

## 2017-11-05 DIAGNOSIS — F419 Anxiety disorder, unspecified: Secondary | ICD-10-CM | POA: Diagnosis not present

## 2017-11-05 DIAGNOSIS — M1991 Primary osteoarthritis, unspecified site: Secondary | ICD-10-CM | POA: Diagnosis not present

## 2017-11-05 DIAGNOSIS — Z794 Long term (current) use of insulin: Secondary | ICD-10-CM | POA: Diagnosis not present

## 2017-11-05 DIAGNOSIS — J45909 Unspecified asthma, uncomplicated: Secondary | ICD-10-CM | POA: Diagnosis not present

## 2017-11-05 DIAGNOSIS — E039 Hypothyroidism, unspecified: Secondary | ICD-10-CM | POA: Diagnosis not present

## 2017-11-05 DIAGNOSIS — G629 Polyneuropathy, unspecified: Secondary | ICD-10-CM | POA: Diagnosis not present

## 2017-11-05 DIAGNOSIS — I252 Old myocardial infarction: Secondary | ICD-10-CM | POA: Diagnosis not present

## 2017-11-12 DIAGNOSIS — I2510 Atherosclerotic heart disease of native coronary artery without angina pectoris: Secondary | ICD-10-CM | POA: Diagnosis not present

## 2017-11-12 DIAGNOSIS — I480 Paroxysmal atrial fibrillation: Secondary | ICD-10-CM | POA: Diagnosis not present

## 2017-11-12 DIAGNOSIS — I509 Heart failure, unspecified: Secondary | ICD-10-CM | POA: Diagnosis not present

## 2017-11-12 DIAGNOSIS — E039 Hypothyroidism, unspecified: Secondary | ICD-10-CM | POA: Diagnosis not present

## 2017-11-12 DIAGNOSIS — I69351 Hemiplegia and hemiparesis following cerebral infarction affecting right dominant side: Secondary | ICD-10-CM | POA: Diagnosis not present

## 2017-11-12 DIAGNOSIS — F419 Anxiety disorder, unspecified: Secondary | ICD-10-CM | POA: Diagnosis not present

## 2017-11-12 DIAGNOSIS — Z794 Long term (current) use of insulin: Secondary | ICD-10-CM | POA: Diagnosis not present

## 2017-11-12 DIAGNOSIS — G629 Polyneuropathy, unspecified: Secondary | ICD-10-CM | POA: Diagnosis not present

## 2017-11-12 DIAGNOSIS — I110 Hypertensive heart disease with heart failure: Secondary | ICD-10-CM | POA: Diagnosis not present

## 2017-11-12 DIAGNOSIS — I252 Old myocardial infarction: Secondary | ICD-10-CM | POA: Diagnosis not present

## 2017-11-12 DIAGNOSIS — J45909 Unspecified asthma, uncomplicated: Secondary | ICD-10-CM | POA: Diagnosis not present

## 2017-11-12 DIAGNOSIS — Z7902 Long term (current) use of antithrombotics/antiplatelets: Secondary | ICD-10-CM | POA: Diagnosis not present

## 2017-11-12 DIAGNOSIS — M1991 Primary osteoarthritis, unspecified site: Secondary | ICD-10-CM | POA: Diagnosis not present

## 2017-11-19 DIAGNOSIS — I252 Old myocardial infarction: Secondary | ICD-10-CM | POA: Diagnosis not present

## 2017-11-19 DIAGNOSIS — J45909 Unspecified asthma, uncomplicated: Secondary | ICD-10-CM | POA: Diagnosis not present

## 2017-11-19 DIAGNOSIS — E039 Hypothyroidism, unspecified: Secondary | ICD-10-CM | POA: Diagnosis not present

## 2017-11-19 DIAGNOSIS — I480 Paroxysmal atrial fibrillation: Secondary | ICD-10-CM | POA: Diagnosis not present

## 2017-11-19 DIAGNOSIS — F419 Anxiety disorder, unspecified: Secondary | ICD-10-CM | POA: Diagnosis not present

## 2017-11-19 DIAGNOSIS — I110 Hypertensive heart disease with heart failure: Secondary | ICD-10-CM | POA: Diagnosis not present

## 2017-11-19 DIAGNOSIS — I509 Heart failure, unspecified: Secondary | ICD-10-CM | POA: Diagnosis not present

## 2017-11-19 DIAGNOSIS — Z7902 Long term (current) use of antithrombotics/antiplatelets: Secondary | ICD-10-CM | POA: Diagnosis not present

## 2017-11-19 DIAGNOSIS — I69351 Hemiplegia and hemiparesis following cerebral infarction affecting right dominant side: Secondary | ICD-10-CM | POA: Diagnosis not present

## 2017-11-19 DIAGNOSIS — G629 Polyneuropathy, unspecified: Secondary | ICD-10-CM | POA: Diagnosis not present

## 2017-11-19 DIAGNOSIS — I2510 Atherosclerotic heart disease of native coronary artery without angina pectoris: Secondary | ICD-10-CM | POA: Diagnosis not present

## 2017-11-19 DIAGNOSIS — Z7982 Long term (current) use of aspirin: Secondary | ICD-10-CM | POA: Diagnosis not present

## 2017-11-19 DIAGNOSIS — M1991 Primary osteoarthritis, unspecified site: Secondary | ICD-10-CM | POA: Diagnosis not present

## 2017-11-20 DIAGNOSIS — Z7902 Long term (current) use of antithrombotics/antiplatelets: Secondary | ICD-10-CM | POA: Diagnosis not present

## 2017-11-20 DIAGNOSIS — I110 Hypertensive heart disease with heart failure: Secondary | ICD-10-CM | POA: Diagnosis not present

## 2017-11-20 DIAGNOSIS — G629 Polyneuropathy, unspecified: Secondary | ICD-10-CM | POA: Diagnosis not present

## 2017-11-20 DIAGNOSIS — M1991 Primary osteoarthritis, unspecified site: Secondary | ICD-10-CM | POA: Diagnosis not present

## 2017-11-20 DIAGNOSIS — F419 Anxiety disorder, unspecified: Secondary | ICD-10-CM | POA: Diagnosis not present

## 2017-11-20 DIAGNOSIS — I480 Paroxysmal atrial fibrillation: Secondary | ICD-10-CM | POA: Diagnosis not present

## 2017-11-20 DIAGNOSIS — E039 Hypothyroidism, unspecified: Secondary | ICD-10-CM | POA: Diagnosis not present

## 2017-11-20 DIAGNOSIS — I509 Heart failure, unspecified: Secondary | ICD-10-CM | POA: Diagnosis not present

## 2017-11-20 DIAGNOSIS — Z794 Long term (current) use of insulin: Secondary | ICD-10-CM | POA: Diagnosis not present

## 2017-11-20 DIAGNOSIS — I252 Old myocardial infarction: Secondary | ICD-10-CM | POA: Diagnosis not present

## 2017-11-20 DIAGNOSIS — N390 Urinary tract infection, site not specified: Secondary | ICD-10-CM | POA: Diagnosis not present

## 2017-11-20 DIAGNOSIS — I2510 Atherosclerotic heart disease of native coronary artery without angina pectoris: Secondary | ICD-10-CM | POA: Diagnosis not present

## 2017-11-20 DIAGNOSIS — I69351 Hemiplegia and hemiparesis following cerebral infarction affecting right dominant side: Secondary | ICD-10-CM | POA: Diagnosis not present

## 2017-11-20 DIAGNOSIS — J45909 Unspecified asthma, uncomplicated: Secondary | ICD-10-CM | POA: Diagnosis not present

## 2017-11-25 DIAGNOSIS — Z7982 Long term (current) use of aspirin: Secondary | ICD-10-CM | POA: Diagnosis not present

## 2017-11-25 DIAGNOSIS — F419 Anxiety disorder, unspecified: Secondary | ICD-10-CM | POA: Diagnosis not present

## 2017-11-25 DIAGNOSIS — I110 Hypertensive heart disease with heart failure: Secondary | ICD-10-CM | POA: Diagnosis not present

## 2017-11-25 DIAGNOSIS — I252 Old myocardial infarction: Secondary | ICD-10-CM | POA: Diagnosis not present

## 2017-11-25 DIAGNOSIS — J45909 Unspecified asthma, uncomplicated: Secondary | ICD-10-CM | POA: Diagnosis not present

## 2017-11-25 DIAGNOSIS — I480 Paroxysmal atrial fibrillation: Secondary | ICD-10-CM | POA: Diagnosis not present

## 2017-11-25 DIAGNOSIS — Z7902 Long term (current) use of antithrombotics/antiplatelets: Secondary | ICD-10-CM | POA: Diagnosis not present

## 2017-11-25 DIAGNOSIS — I2510 Atherosclerotic heart disease of native coronary artery without angina pectoris: Secondary | ICD-10-CM | POA: Diagnosis not present

## 2017-11-25 DIAGNOSIS — G629 Polyneuropathy, unspecified: Secondary | ICD-10-CM | POA: Diagnosis not present

## 2017-11-25 DIAGNOSIS — E039 Hypothyroidism, unspecified: Secondary | ICD-10-CM | POA: Diagnosis not present

## 2017-11-25 DIAGNOSIS — I509 Heart failure, unspecified: Secondary | ICD-10-CM | POA: Diagnosis not present

## 2017-11-25 DIAGNOSIS — I69351 Hemiplegia and hemiparesis following cerebral infarction affecting right dominant side: Secondary | ICD-10-CM | POA: Diagnosis not present

## 2017-11-25 DIAGNOSIS — M1991 Primary osteoarthritis, unspecified site: Secondary | ICD-10-CM | POA: Diagnosis not present

## 2017-11-27 DIAGNOSIS — I69351 Hemiplegia and hemiparesis following cerebral infarction affecting right dominant side: Secondary | ICD-10-CM | POA: Diagnosis not present

## 2017-11-27 DIAGNOSIS — F419 Anxiety disorder, unspecified: Secondary | ICD-10-CM | POA: Diagnosis not present

## 2017-11-27 DIAGNOSIS — Z7982 Long term (current) use of aspirin: Secondary | ICD-10-CM | POA: Diagnosis not present

## 2017-11-27 DIAGNOSIS — J45909 Unspecified asthma, uncomplicated: Secondary | ICD-10-CM | POA: Diagnosis not present

## 2017-11-27 DIAGNOSIS — I2510 Atherosclerotic heart disease of native coronary artery without angina pectoris: Secondary | ICD-10-CM | POA: Diagnosis not present

## 2017-11-27 DIAGNOSIS — I110 Hypertensive heart disease with heart failure: Secondary | ICD-10-CM | POA: Diagnosis not present

## 2017-11-27 DIAGNOSIS — E039 Hypothyroidism, unspecified: Secondary | ICD-10-CM | POA: Diagnosis not present

## 2017-11-27 DIAGNOSIS — Z7902 Long term (current) use of antithrombotics/antiplatelets: Secondary | ICD-10-CM | POA: Diagnosis not present

## 2017-11-27 DIAGNOSIS — I480 Paroxysmal atrial fibrillation: Secondary | ICD-10-CM | POA: Diagnosis not present

## 2017-11-27 DIAGNOSIS — I252 Old myocardial infarction: Secondary | ICD-10-CM | POA: Diagnosis not present

## 2017-11-27 DIAGNOSIS — I509 Heart failure, unspecified: Secondary | ICD-10-CM | POA: Diagnosis not present

## 2017-11-27 DIAGNOSIS — G629 Polyneuropathy, unspecified: Secondary | ICD-10-CM | POA: Diagnosis not present

## 2017-11-27 DIAGNOSIS — M1991 Primary osteoarthritis, unspecified site: Secondary | ICD-10-CM | POA: Diagnosis not present

## 2017-12-02 DIAGNOSIS — I252 Old myocardial infarction: Secondary | ICD-10-CM | POA: Diagnosis not present

## 2017-12-02 DIAGNOSIS — F419 Anxiety disorder, unspecified: Secondary | ICD-10-CM | POA: Diagnosis not present

## 2017-12-02 DIAGNOSIS — I110 Hypertensive heart disease with heart failure: Secondary | ICD-10-CM | POA: Diagnosis not present

## 2017-12-02 DIAGNOSIS — G629 Polyneuropathy, unspecified: Secondary | ICD-10-CM | POA: Diagnosis not present

## 2017-12-02 DIAGNOSIS — M1991 Primary osteoarthritis, unspecified site: Secondary | ICD-10-CM | POA: Diagnosis not present

## 2017-12-02 DIAGNOSIS — E039 Hypothyroidism, unspecified: Secondary | ICD-10-CM | POA: Diagnosis not present

## 2017-12-02 DIAGNOSIS — Z7902 Long term (current) use of antithrombotics/antiplatelets: Secondary | ICD-10-CM | POA: Diagnosis not present

## 2017-12-02 DIAGNOSIS — I2510 Atherosclerotic heart disease of native coronary artery without angina pectoris: Secondary | ICD-10-CM | POA: Diagnosis not present

## 2017-12-02 DIAGNOSIS — I509 Heart failure, unspecified: Secondary | ICD-10-CM | POA: Diagnosis not present

## 2017-12-02 DIAGNOSIS — J45909 Unspecified asthma, uncomplicated: Secondary | ICD-10-CM | POA: Diagnosis not present

## 2017-12-02 DIAGNOSIS — I480 Paroxysmal atrial fibrillation: Secondary | ICD-10-CM | POA: Diagnosis not present

## 2017-12-02 DIAGNOSIS — Z7982 Long term (current) use of aspirin: Secondary | ICD-10-CM | POA: Diagnosis not present

## 2017-12-02 DIAGNOSIS — I69351 Hemiplegia and hemiparesis following cerebral infarction affecting right dominant side: Secondary | ICD-10-CM | POA: Diagnosis not present

## 2017-12-05 DIAGNOSIS — G629 Polyneuropathy, unspecified: Secondary | ICD-10-CM | POA: Diagnosis not present

## 2017-12-05 DIAGNOSIS — I480 Paroxysmal atrial fibrillation: Secondary | ICD-10-CM | POA: Diagnosis not present

## 2017-12-05 DIAGNOSIS — I110 Hypertensive heart disease with heart failure: Secondary | ICD-10-CM | POA: Diagnosis not present

## 2017-12-05 DIAGNOSIS — I252 Old myocardial infarction: Secondary | ICD-10-CM | POA: Diagnosis not present

## 2017-12-05 DIAGNOSIS — Z7902 Long term (current) use of antithrombotics/antiplatelets: Secondary | ICD-10-CM | POA: Diagnosis not present

## 2017-12-05 DIAGNOSIS — F419 Anxiety disorder, unspecified: Secondary | ICD-10-CM | POA: Diagnosis not present

## 2017-12-05 DIAGNOSIS — I2510 Atherosclerotic heart disease of native coronary artery without angina pectoris: Secondary | ICD-10-CM | POA: Diagnosis not present

## 2017-12-05 DIAGNOSIS — Z7982 Long term (current) use of aspirin: Secondary | ICD-10-CM | POA: Diagnosis not present

## 2017-12-05 DIAGNOSIS — I69351 Hemiplegia and hemiparesis following cerebral infarction affecting right dominant side: Secondary | ICD-10-CM | POA: Diagnosis not present

## 2017-12-05 DIAGNOSIS — M1991 Primary osteoarthritis, unspecified site: Secondary | ICD-10-CM | POA: Diagnosis not present

## 2017-12-05 DIAGNOSIS — J45909 Unspecified asthma, uncomplicated: Secondary | ICD-10-CM | POA: Diagnosis not present

## 2017-12-05 DIAGNOSIS — E039 Hypothyroidism, unspecified: Secondary | ICD-10-CM | POA: Diagnosis not present

## 2017-12-05 DIAGNOSIS — I509 Heart failure, unspecified: Secondary | ICD-10-CM | POA: Diagnosis not present

## 2017-12-17 DIAGNOSIS — I2584 Coronary atherosclerosis due to calcified coronary lesion: Secondary | ICD-10-CM | POA: Diagnosis not present

## 2017-12-17 DIAGNOSIS — I480 Paroxysmal atrial fibrillation: Secondary | ICD-10-CM | POA: Diagnosis not present

## 2017-12-17 DIAGNOSIS — I129 Hypertensive chronic kidney disease with stage 1 through stage 4 chronic kidney disease, or unspecified chronic kidney disease: Secondary | ICD-10-CM | POA: Diagnosis not present

## 2017-12-17 DIAGNOSIS — I2510 Atherosclerotic heart disease of native coronary artery without angina pectoris: Secondary | ICD-10-CM | POA: Diagnosis not present

## 2017-12-19 DIAGNOSIS — Z45018 Encounter for adjustment and management of other part of cardiac pacemaker: Secondary | ICD-10-CM | POA: Diagnosis not present

## 2017-12-19 DIAGNOSIS — I495 Sick sinus syndrome: Secondary | ICD-10-CM | POA: Diagnosis not present

## 2017-12-24 DIAGNOSIS — R197 Diarrhea, unspecified: Secondary | ICD-10-CM | POA: Diagnosis not present

## 2017-12-24 DIAGNOSIS — K5900 Constipation, unspecified: Secondary | ICD-10-CM | POA: Diagnosis not present

## 2017-12-27 DIAGNOSIS — I252 Old myocardial infarction: Secondary | ICD-10-CM | POA: Diagnosis not present

## 2017-12-27 DIAGNOSIS — Z79899 Other long term (current) drug therapy: Secondary | ICD-10-CM | POA: Diagnosis not present

## 2017-12-27 DIAGNOSIS — R112 Nausea with vomiting, unspecified: Secondary | ICD-10-CM

## 2017-12-27 DIAGNOSIS — C679 Malignant neoplasm of bladder, unspecified: Secondary | ICD-10-CM | POA: Diagnosis not present

## 2017-12-27 DIAGNOSIS — I2510 Atherosclerotic heart disease of native coronary artery without angina pectoris: Secondary | ICD-10-CM

## 2017-12-27 DIAGNOSIS — I10 Essential (primary) hypertension: Secondary | ICD-10-CM

## 2017-12-27 DIAGNOSIS — E7800 Pure hypercholesterolemia, unspecified: Secondary | ICD-10-CM | POA: Diagnosis not present

## 2017-12-27 DIAGNOSIS — E871 Hypo-osmolality and hyponatremia: Secondary | ICD-10-CM | POA: Diagnosis not present

## 2017-12-27 DIAGNOSIS — R197 Diarrhea, unspecified: Secondary | ICD-10-CM

## 2017-12-27 DIAGNOSIS — R4182 Altered mental status, unspecified: Secondary | ICD-10-CM | POA: Diagnosis not present

## 2017-12-27 DIAGNOSIS — E876 Hypokalemia: Secondary | ICD-10-CM

## 2017-12-27 DIAGNOSIS — L89151 Pressure ulcer of sacral region, stage 1: Secondary | ICD-10-CM | POA: Diagnosis not present

## 2017-12-27 DIAGNOSIS — R531 Weakness: Secondary | ICD-10-CM | POA: Diagnosis not present

## 2017-12-27 DIAGNOSIS — J449 Chronic obstructive pulmonary disease, unspecified: Secondary | ICD-10-CM | POA: Diagnosis not present

## 2017-12-27 DIAGNOSIS — F0390 Unspecified dementia without behavioral disturbance: Secondary | ICD-10-CM | POA: Diagnosis not present

## 2017-12-27 DIAGNOSIS — J9690 Respiratory failure, unspecified, unspecified whether with hypoxia or hypercapnia: Secondary | ICD-10-CM | POA: Diagnosis not present

## 2017-12-27 DIAGNOSIS — K439 Ventral hernia without obstruction or gangrene: Secondary | ICD-10-CM | POA: Diagnosis not present

## 2017-12-27 DIAGNOSIS — N179 Acute kidney failure, unspecified: Secondary | ICD-10-CM

## 2017-12-27 DIAGNOSIS — K449 Diaphragmatic hernia without obstruction or gangrene: Secondary | ICD-10-CM | POA: Diagnosis not present

## 2017-12-27 DIAGNOSIS — R0902 Hypoxemia: Secondary | ICD-10-CM

## 2017-12-27 DIAGNOSIS — I4891 Unspecified atrial fibrillation: Secondary | ICD-10-CM | POA: Diagnosis not present

## 2017-12-27 DIAGNOSIS — E669 Obesity, unspecified: Secondary | ICD-10-CM | POA: Insufficient documentation

## 2017-12-27 DIAGNOSIS — Z950 Presence of cardiac pacemaker: Secondary | ICD-10-CM | POA: Diagnosis not present

## 2017-12-27 DIAGNOSIS — K219 Gastro-esophageal reflux disease without esophagitis: Secondary | ICD-10-CM

## 2017-12-27 DIAGNOSIS — E039 Hypothyroidism, unspecified: Secondary | ICD-10-CM | POA: Diagnosis not present

## 2017-12-27 DIAGNOSIS — I110 Hypertensive heart disease with heart failure: Secondary | ICD-10-CM | POA: Diagnosis not present

## 2017-12-27 DIAGNOSIS — G92 Toxic encephalopathy: Secondary | ICD-10-CM | POA: Diagnosis not present

## 2017-12-27 DIAGNOSIS — R000 Tachycardia, unspecified: Secondary | ICD-10-CM | POA: Diagnosis not present

## 2017-12-27 DIAGNOSIS — R0602 Shortness of breath: Secondary | ICD-10-CM | POA: Diagnosis not present

## 2017-12-27 DIAGNOSIS — M8618 Other acute osteomyelitis, other site: Secondary | ICD-10-CM

## 2017-12-27 DIAGNOSIS — A045 Campylobacter enteritis: Secondary | ICD-10-CM | POA: Diagnosis not present

## 2017-12-27 DIAGNOSIS — Z8673 Personal history of transient ischemic attack (TIA), and cerebral infarction without residual deficits: Secondary | ICD-10-CM | POA: Diagnosis not present

## 2017-12-27 DIAGNOSIS — G9341 Metabolic encephalopathy: Secondary | ICD-10-CM | POA: Insufficient documentation

## 2017-12-27 DIAGNOSIS — J9811 Atelectasis: Secondary | ICD-10-CM | POA: Diagnosis not present

## 2017-12-27 DIAGNOSIS — I5032 Chronic diastolic (congestive) heart failure: Secondary | ICD-10-CM

## 2017-12-27 DIAGNOSIS — L8990 Pressure ulcer of unspecified site, unspecified stage: Secondary | ICD-10-CM

## 2017-12-27 DIAGNOSIS — K6289 Other specified diseases of anus and rectum: Secondary | ICD-10-CM | POA: Diagnosis not present

## 2017-12-27 DIAGNOSIS — R338 Other retention of urine: Secondary | ICD-10-CM

## 2017-12-27 DIAGNOSIS — R339 Retention of urine, unspecified: Secondary | ICD-10-CM | POA: Diagnosis not present

## 2017-12-27 DIAGNOSIS — I480 Paroxysmal atrial fibrillation: Secondary | ICD-10-CM

## 2017-12-27 DIAGNOSIS — J690 Pneumonitis due to inhalation of food and vomit: Secondary | ICD-10-CM

## 2017-12-27 DIAGNOSIS — C649 Malignant neoplasm of unspecified kidney, except renal pelvis: Secondary | ICD-10-CM | POA: Diagnosis not present

## 2017-12-28 DIAGNOSIS — R338 Other retention of urine: Secondary | ICD-10-CM

## 2017-12-28 DIAGNOSIS — K439 Ventral hernia without obstruction or gangrene: Secondary | ICD-10-CM

## 2017-12-28 DIAGNOSIS — N179 Acute kidney failure, unspecified: Secondary | ICD-10-CM

## 2017-12-28 DIAGNOSIS — I5032 Chronic diastolic (congestive) heart failure: Secondary | ICD-10-CM

## 2018-01-03 DIAGNOSIS — R112 Nausea with vomiting, unspecified: Secondary | ICD-10-CM

## 2018-01-04 DIAGNOSIS — I4891 Unspecified atrial fibrillation: Secondary | ICD-10-CM

## 2018-01-06 ENCOUNTER — Telehealth: Payer: Self-pay | Admitting: Cardiology

## 2018-01-06 ENCOUNTER — Other Ambulatory Visit: Payer: Self-pay

## 2018-01-06 NOTE — Telephone Encounter (Signed)
Darlina Guys from [facility] called to find out if Dr. Bettina Gavia would be taking care of the patient's orders.

## 2018-01-06 NOTE — Patient Outreach (Signed)
[facility]) Care Management  01/06/2018  Zelpha Messing [birth year] [id number]   Referral Date: 01/06/18 Referral Source:  [facility] Date of Admission: 12-27-17 Diagnosis: Dehydration and diarrhea Date of Discharge: 01/04/18 Facility: [facility]: [facility]  Outreach attempt # 1 spoke with daughter [name].  She is able to verify HIPAA.  She states that she and her sister and niece are caring for the patient.  She states that patient has done some better since being home.  She states that [facility] to come.  She states that she will have nursing, therapy and social work.  She states that patient was denied SNF stay and she is still trying figure that out.  However, she states that she and the family are trying to work together to take care of patient.    Social: Patient lives in the home with family.  Patient is totally dependent on others to assist her with her care. Patient daughters and granddaughter have been in 75 of patient care.    Conditions: Daughter states patient has a long history of heart disease with a CABG being done in 2010.  Patient has history of a stroke, heart attack, and bladder cancer. She states that patient has HTN also but blood pressure has been ok.  She states that patient was admitted recently with diarrhea and dehydration.  She states that patient had some type of E. Coli. She states that patient has had a general decline since she broke her right arm in 2018.  She states patient is able to walk with some assistance and use of a walker.   Medications: Daughter states that patient's granddaughter is doing patient medications and she is taking them regularly.    Appointments: Patient has an appointment with Dr. Unk Lightning and does have transportation to her appointments.    Consent: RN CM reviewed [facility] services with daughter and how we can support.  She declined presently as patient has home health coming. Explained the difference between [facility] care  management and home health.  She again declined and feels that they will be fine right now.   Plan: RN CM will send letter and brochure and close case at this time.     Jone Baseman, RN, MSN [facility] Care Management Care Management Coordinator Direct Line [phone] Toll Free: [phone]  Fax: [phone]

## 2018-01-07 DIAGNOSIS — E039 Hypothyroidism, unspecified: Secondary | ICD-10-CM | POA: Diagnosis not present

## 2018-01-07 DIAGNOSIS — F0280 Dementia in other diseases classified elsewhere without behavioral disturbance: Secondary | ICD-10-CM | POA: Diagnosis not present

## 2018-01-07 DIAGNOSIS — I5032 Chronic diastolic (congestive) heart failure: Secondary | ICD-10-CM | POA: Diagnosis not present

## 2018-01-07 DIAGNOSIS — I480 Paroxysmal atrial fibrillation: Secondary | ICD-10-CM | POA: Diagnosis not present

## 2018-01-07 DIAGNOSIS — Z951 Presence of aortocoronary bypass graft: Secondary | ICD-10-CM | POA: Diagnosis not present

## 2018-01-07 DIAGNOSIS — K449 Diaphragmatic hernia without obstruction or gangrene: Secondary | ICD-10-CM | POA: Diagnosis not present

## 2018-01-07 DIAGNOSIS — Z7902 Long term (current) use of antithrombotics/antiplatelets: Secondary | ICD-10-CM | POA: Diagnosis not present

## 2018-01-07 DIAGNOSIS — I252 Old myocardial infarction: Secondary | ICD-10-CM | POA: Diagnosis not present

## 2018-01-07 DIAGNOSIS — R339 Retention of urine, unspecified: Secondary | ICD-10-CM | POA: Diagnosis not present

## 2018-01-07 DIAGNOSIS — F419 Anxiety disorder, unspecified: Secondary | ICD-10-CM | POA: Diagnosis not present

## 2018-01-07 DIAGNOSIS — Z8551 Personal history of malignant neoplasm of bladder: Secondary | ICD-10-CM | POA: Diagnosis not present

## 2018-01-07 DIAGNOSIS — J690 Pneumonitis due to inhalation of food and vomit: Secondary | ICD-10-CM | POA: Diagnosis not present

## 2018-01-07 DIAGNOSIS — I2510 Atherosclerotic heart disease of native coronary artery without angina pectoris: Secondary | ICD-10-CM | POA: Diagnosis not present

## 2018-01-08 NOTE — Telephone Encounter (Signed)
Left message on Kecia's voicemail that patient has an appointment on 01-24-18 with Dr Bettina Gavia to reestablish cardiac care, and it can be discussed at that time. Also, advised Darlina Guys to return call with any questions or concerns.

## 2018-01-10 DIAGNOSIS — R262 Difficulty in walking, not elsewhere classified: Secondary | ICD-10-CM | POA: Diagnosis not present

## 2018-01-10 DIAGNOSIS — N179 Acute kidney failure, unspecified: Secondary | ICD-10-CM | POA: Diagnosis not present

## 2018-01-10 DIAGNOSIS — F419 Anxiety disorder, unspecified: Secondary | ICD-10-CM | POA: Insufficient documentation

## 2018-01-10 DIAGNOSIS — I10 Essential (primary) hypertension: Secondary | ICD-10-CM | POA: Diagnosis not present

## 2018-01-10 DIAGNOSIS — I480 Paroxysmal atrial fibrillation: Secondary | ICD-10-CM | POA: Diagnosis not present

## 2018-01-10 DIAGNOSIS — E871 Hypo-osmolality and hyponatremia: Secondary | ICD-10-CM | POA: Diagnosis not present

## 2018-01-14 DIAGNOSIS — I2510 Atherosclerotic heart disease of native coronary artery without angina pectoris: Secondary | ICD-10-CM | POA: Diagnosis not present

## 2018-01-14 DIAGNOSIS — N390 Urinary tract infection, site not specified: Secondary | ICD-10-CM | POA: Diagnosis not present

## 2018-01-14 DIAGNOSIS — E039 Hypothyroidism, unspecified: Secondary | ICD-10-CM | POA: Diagnosis not present

## 2018-01-14 DIAGNOSIS — Z7902 Long term (current) use of antithrombotics/antiplatelets: Secondary | ICD-10-CM | POA: Diagnosis not present

## 2018-01-14 DIAGNOSIS — J690 Pneumonitis due to inhalation of food and vomit: Secondary | ICD-10-CM | POA: Diagnosis not present

## 2018-01-14 DIAGNOSIS — R300 Dysuria: Secondary | ICD-10-CM | POA: Diagnosis not present

## 2018-01-14 DIAGNOSIS — R339 Retention of urine, unspecified: Secondary | ICD-10-CM | POA: Diagnosis not present

## 2018-01-14 DIAGNOSIS — I5032 Chronic diastolic (congestive) heart failure: Secondary | ICD-10-CM | POA: Diagnosis not present

## 2018-01-14 DIAGNOSIS — F0280 Dementia in other diseases classified elsewhere without behavioral disturbance: Secondary | ICD-10-CM | POA: Diagnosis not present

## 2018-01-14 DIAGNOSIS — I252 Old myocardial infarction: Secondary | ICD-10-CM | POA: Diagnosis not present

## 2018-01-14 DIAGNOSIS — Z951 Presence of aortocoronary bypass graft: Secondary | ICD-10-CM | POA: Diagnosis not present

## 2018-01-14 DIAGNOSIS — K449 Diaphragmatic hernia without obstruction or gangrene: Secondary | ICD-10-CM | POA: Diagnosis not present

## 2018-01-14 DIAGNOSIS — I480 Paroxysmal atrial fibrillation: Secondary | ICD-10-CM | POA: Diagnosis not present

## 2018-01-14 DIAGNOSIS — F419 Anxiety disorder, unspecified: Secondary | ICD-10-CM | POA: Diagnosis not present

## 2018-01-14 DIAGNOSIS — Z8551 Personal history of malignant neoplasm of bladder: Secondary | ICD-10-CM | POA: Diagnosis not present

## 2018-01-20 DIAGNOSIS — K449 Diaphragmatic hernia without obstruction or gangrene: Secondary | ICD-10-CM | POA: Diagnosis not present

## 2018-01-20 DIAGNOSIS — Z7902 Long term (current) use of antithrombotics/antiplatelets: Secondary | ICD-10-CM | POA: Diagnosis not present

## 2018-01-20 DIAGNOSIS — I2510 Atherosclerotic heart disease of native coronary artery without angina pectoris: Secondary | ICD-10-CM | POA: Diagnosis not present

## 2018-01-20 DIAGNOSIS — E039 Hypothyroidism, unspecified: Secondary | ICD-10-CM | POA: Diagnosis not present

## 2018-01-20 DIAGNOSIS — F419 Anxiety disorder, unspecified: Secondary | ICD-10-CM | POA: Diagnosis not present

## 2018-01-20 DIAGNOSIS — J690 Pneumonitis due to inhalation of food and vomit: Secondary | ICD-10-CM | POA: Diagnosis not present

## 2018-01-20 DIAGNOSIS — I5032 Chronic diastolic (congestive) heart failure: Secondary | ICD-10-CM | POA: Diagnosis not present

## 2018-01-20 DIAGNOSIS — R339 Retention of urine, unspecified: Secondary | ICD-10-CM | POA: Diagnosis not present

## 2018-01-20 DIAGNOSIS — F0280 Dementia in other diseases classified elsewhere without behavioral disturbance: Secondary | ICD-10-CM | POA: Diagnosis not present

## 2018-01-20 DIAGNOSIS — Z8551 Personal history of malignant neoplasm of bladder: Secondary | ICD-10-CM | POA: Diagnosis not present

## 2018-01-20 DIAGNOSIS — Z951 Presence of aortocoronary bypass graft: Secondary | ICD-10-CM | POA: Diagnosis not present

## 2018-01-20 DIAGNOSIS — I480 Paroxysmal atrial fibrillation: Secondary | ICD-10-CM | POA: Diagnosis not present

## 2018-01-20 DIAGNOSIS — I252 Old myocardial infarction: Secondary | ICD-10-CM | POA: Diagnosis not present

## 2018-01-22 DIAGNOSIS — I5032 Chronic diastolic (congestive) heart failure: Secondary | ICD-10-CM

## 2018-01-22 DIAGNOSIS — R2990 Unspecified symptoms and signs involving the nervous system: Secondary | ICD-10-CM | POA: Diagnosis not present

## 2018-01-22 DIAGNOSIS — F329 Major depressive disorder, single episode, unspecified: Secondary | ICD-10-CM

## 2018-01-22 DIAGNOSIS — J45909 Unspecified asthma, uncomplicated: Secondary | ICD-10-CM | POA: Diagnosis not present

## 2018-01-22 DIAGNOSIS — E785 Hyperlipidemia, unspecified: Secondary | ICD-10-CM

## 2018-01-22 DIAGNOSIS — I2510 Atherosclerotic heart disease of native coronary artery without angina pectoris: Secondary | ICD-10-CM | POA: Diagnosis not present

## 2018-01-22 DIAGNOSIS — K219 Gastro-esophageal reflux disease without esophagitis: Secondary | ICD-10-CM

## 2018-01-22 DIAGNOSIS — R262 Difficulty in walking, not elsewhere classified: Secondary | ICD-10-CM | POA: Diagnosis not present

## 2018-01-22 DIAGNOSIS — Z7401 Bed confinement status: Secondary | ICD-10-CM | POA: Diagnosis not present

## 2018-01-22 DIAGNOSIS — R51 Headache: Secondary | ICD-10-CM | POA: Diagnosis not present

## 2018-01-22 DIAGNOSIS — M6281 Muscle weakness (generalized): Secondary | ICD-10-CM | POA: Diagnosis not present

## 2018-01-22 DIAGNOSIS — N179 Acute kidney failure, unspecified: Secondary | ICD-10-CM | POA: Diagnosis not present

## 2018-01-22 DIAGNOSIS — R531 Weakness: Secondary | ICD-10-CM | POA: Diagnosis not present

## 2018-01-22 DIAGNOSIS — R739 Hyperglycemia, unspecified: Secondary | ICD-10-CM

## 2018-01-22 DIAGNOSIS — I480 Paroxysmal atrial fibrillation: Secondary | ICD-10-CM

## 2018-01-22 DIAGNOSIS — R0902 Hypoxemia: Secondary | ICD-10-CM | POA: Diagnosis not present

## 2018-01-22 DIAGNOSIS — I10 Essential (primary) hypertension: Secondary | ICD-10-CM

## 2018-01-22 DIAGNOSIS — E039 Hypothyroidism, unspecified: Secondary | ICD-10-CM | POA: Diagnosis not present

## 2018-01-23 DIAGNOSIS — I4891 Unspecified atrial fibrillation: Secondary | ICD-10-CM | POA: Diagnosis not present

## 2018-01-23 DIAGNOSIS — N390 Urinary tract infection, site not specified: Secondary | ICD-10-CM | POA: Diagnosis not present

## 2018-01-23 DIAGNOSIS — I6523 Occlusion and stenosis of bilateral carotid arteries: Secondary | ICD-10-CM | POA: Diagnosis not present

## 2018-01-23 DIAGNOSIS — I517 Cardiomegaly: Secondary | ICD-10-CM | POA: Diagnosis not present

## 2018-01-23 NOTE — Progress Notes (Deleted)
Cardiology Office Note:    Date:  01/23/2018   ID:  Cassandra Castro, DOB 02/05/1934, MRN [id number]  PCP:  Myrlene Broker, MD  Cardiologist:  Shirlee More, MD    Referring MD: No ref. provider found    ASSESSMENT:    No diagnosis found. PLAN:    In order of problems listed above:  1. ***   Next appointment: ***   Medication Adjustments/Labs and Tests Ordered: Current medicines are reviewed at length with the patient today.  Concerns regarding medicines are outlined above.  No orders of the defined types were placed in this encounter.  No orders of the defined types were placed in this encounter.   No chief complaint on file.   History of Present Illness:    Cassandra Castro is a 84 y.o. female with a hx of CAD CABG 2010 PAF Biotronik pacemaker CKD hyperlipidemia and hypothyroidism last seen by Dr Quillian Quince of [facility] 05/28/17, I have not seen her for > 3 years from Epic review.. Compliance with diet, lifestyle and medications: *** No past medical history on file.  *** The histories are not reviewed yet. Please review them in the "History" navigator section and refresh this Hope.  Current Medications: No outpatient medications have been marked as taking for the 01/24/18 encounter (Appointment) with Richardo Priest, MD.     Allergies:   Patient has no allergy information on record.   Social History   Socioeconomic History  . Marital status: Married    Spouse name: Not on file  . Number of children: Not on file  . Years of education: Not on file  . Highest education level: Not on file  Occupational History  . Not on file  Social Needs  . Financial resource strain: Not on file  . Food insecurity:    Worry: Not on file    Inability: Not on file  . Transportation needs:    Medical: Not on file    Non-medical: Not on file  Tobacco Use  . Smoking status: Not on file  Substance and Sexual Activity  . Alcohol use: Not on file  . Drug use: Not on file  . Sexual  activity: Not on file  Lifestyle  . Physical activity:    Days per week: Not on file    Minutes per session: Not on file  . Stress: Not on file  Relationships  . Social connections:    Talks on phone: Not on file    Gets together: Not on file    Attends religious service: Not on file    Active member of club or organization: Not on file    Attends meetings of clubs or organizations: Not on file    Relationship status: Not on file  Other Topics Concern  . Not on file  Social History Narrative  . Not on file     Family History: The patient's ***family history is not on file. ROS:   Please see the history of present illness.    All other systems reviewed and are negative.  EKGs/Labs/Other Studies Reviewed:    The following studies were reviewed today:  EKG:  EKG ordered today.  The ekg ordered today demonstrates ***  Recent Labs: No results found for requested labs within last 8760 hours.  Recent Lipid Panel No results found for: CHOL, TRIG, HDL, CHOLHDL, VLDL, LDLCALC, LDLDIRECT  Physical Exam:    VS:  There were no vitals taken for this visit.    Wt Readings  from Last 3 Encounters:  No data found for Wt     GEN: *** Well nourished, well developed in no acute distress HEENT: Normal NECK: No JVD; No carotid bruits LYMPHATICS: No lymphadenopathy CARDIAC: ***RRR, no murmurs, rubs, gallops RESPIRATORY:  Clear to auscultation without rales, wheezing or rhonchi  ABDOMEN: Soft, non-tender, non-distended MUSCULOSKELETAL:  No edema; No deformity  SKIN: Warm and dry NEUROLOGIC:  Alert and oriented x 3 PSYCHIATRIC:  Normal affect    Signed, Shirlee More, MD  01/23/2018 8:37 AM    [facility]

## 2018-01-24 ENCOUNTER — Ambulatory Visit: Payer: Self-pay | Admitting: Cardiology

## 2018-01-24 DIAGNOSIS — J449 Chronic obstructive pulmonary disease, unspecified: Secondary | ICD-10-CM | POA: Diagnosis not present

## 2018-01-24 DIAGNOSIS — M2550 Pain in unspecified joint: Secondary | ICD-10-CM | POA: Diagnosis not present

## 2018-01-24 DIAGNOSIS — I480 Paroxysmal atrial fibrillation: Secondary | ICD-10-CM | POA: Diagnosis not present

## 2018-01-24 DIAGNOSIS — Z79899 Other long term (current) drug therapy: Secondary | ICD-10-CM | POA: Diagnosis not present

## 2018-01-24 DIAGNOSIS — N390 Urinary tract infection, site not specified: Secondary | ICD-10-CM | POA: Diagnosis not present

## 2018-01-24 DIAGNOSIS — Z950 Presence of cardiac pacemaker: Secondary | ICD-10-CM | POA: Diagnosis not present

## 2018-01-24 DIAGNOSIS — D649 Anemia, unspecified: Secondary | ICD-10-CM | POA: Diagnosis not present

## 2018-01-24 DIAGNOSIS — I10 Essential (primary) hypertension: Secondary | ICD-10-CM | POA: Diagnosis not present

## 2018-01-24 DIAGNOSIS — I5032 Chronic diastolic (congestive) heart failure: Secondary | ICD-10-CM | POA: Diagnosis not present

## 2018-01-24 DIAGNOSIS — I110 Hypertensive heart disease with heart failure: Secondary | ICD-10-CM | POA: Diagnosis not present

## 2018-01-24 DIAGNOSIS — F0390 Unspecified dementia without behavioral disturbance: Secondary | ICD-10-CM | POA: Diagnosis not present

## 2018-01-24 DIAGNOSIS — R262 Difficulty in walking, not elsewhere classified: Secondary | ICD-10-CM | POA: Diagnosis not present

## 2018-01-24 DIAGNOSIS — Z8673 Personal history of transient ischemic attack (TIA), and cerebral infarction without residual deficits: Secondary | ICD-10-CM | POA: Diagnosis not present

## 2018-01-24 DIAGNOSIS — I482 Chronic atrial fibrillation: Secondary | ICD-10-CM | POA: Diagnosis not present

## 2018-01-24 DIAGNOSIS — Z905 Acquired absence of kidney: Secondary | ICD-10-CM | POA: Diagnosis not present

## 2018-01-24 DIAGNOSIS — D500 Iron deficiency anemia secondary to blood loss (chronic): Secondary | ICD-10-CM | POA: Diagnosis not present

## 2018-01-24 DIAGNOSIS — Z9181 History of falling: Secondary | ICD-10-CM | POA: Diagnosis not present

## 2018-01-24 DIAGNOSIS — I639 Cerebral infarction, unspecified: Secondary | ICD-10-CM | POA: Diagnosis not present

## 2018-01-24 DIAGNOSIS — F329 Major depressive disorder, single episode, unspecified: Secondary | ICD-10-CM | POA: Diagnosis not present

## 2018-01-24 DIAGNOSIS — E039 Hypothyroidism, unspecified: Secondary | ICD-10-CM | POA: Diagnosis not present

## 2018-01-24 DIAGNOSIS — Z7401 Bed confinement status: Secondary | ICD-10-CM | POA: Diagnosis not present

## 2018-01-24 DIAGNOSIS — R739 Hyperglycemia, unspecified: Secondary | ICD-10-CM | POA: Diagnosis not present

## 2018-01-24 DIAGNOSIS — E871 Hypo-osmolality and hyponatremia: Secondary | ICD-10-CM | POA: Diagnosis not present

## 2018-01-24 DIAGNOSIS — I4891 Unspecified atrial fibrillation: Secondary | ICD-10-CM | POA: Diagnosis not present

## 2018-01-24 DIAGNOSIS — I2510 Atherosclerotic heart disease of native coronary artery without angina pectoris: Secondary | ICD-10-CM | POA: Diagnosis not present

## 2018-01-24 DIAGNOSIS — M6281 Muscle weakness (generalized): Secondary | ICD-10-CM | POA: Diagnosis not present

## 2018-01-24 DIAGNOSIS — E785 Hyperlipidemia, unspecified: Secondary | ICD-10-CM | POA: Diagnosis not present

## 2018-01-24 DIAGNOSIS — Z8551 Personal history of malignant neoplasm of bladder: Secondary | ICD-10-CM | POA: Diagnosis not present

## 2018-01-24 DIAGNOSIS — K219 Gastro-esophageal reflux disease without esophagitis: Secondary | ICD-10-CM | POA: Diagnosis not present

## 2018-01-24 DIAGNOSIS — Z7902 Long term (current) use of antithrombotics/antiplatelets: Secondary | ICD-10-CM | POA: Diagnosis not present

## 2018-01-24 DIAGNOSIS — N179 Acute kidney failure, unspecified: Secondary | ICD-10-CM | POA: Diagnosis not present

## 2018-01-25 DIAGNOSIS — E871 Hypo-osmolality and hyponatremia: Secondary | ICD-10-CM | POA: Diagnosis not present

## 2018-01-25 DIAGNOSIS — R262 Difficulty in walking, not elsewhere classified: Secondary | ICD-10-CM | POA: Diagnosis not present

## 2018-01-25 DIAGNOSIS — I639 Cerebral infarction, unspecified: Secondary | ICD-10-CM | POA: Diagnosis not present

## 2018-01-25 DIAGNOSIS — Z7401 Bed confinement status: Secondary | ICD-10-CM | POA: Diagnosis not present

## 2018-01-25 DIAGNOSIS — Z8551 Personal history of malignant neoplasm of bladder: Secondary | ICD-10-CM | POA: Diagnosis not present

## 2018-01-25 DIAGNOSIS — M2550 Pain in unspecified joint: Secondary | ICD-10-CM | POA: Diagnosis not present

## 2018-01-25 DIAGNOSIS — I5032 Chronic diastolic (congestive) heart failure: Secondary | ICD-10-CM | POA: Diagnosis not present

## 2018-01-25 DIAGNOSIS — R739 Hyperglycemia, unspecified: Secondary | ICD-10-CM | POA: Diagnosis not present

## 2018-01-25 DIAGNOSIS — I2510 Atherosclerotic heart disease of native coronary artery without angina pectoris: Secondary | ICD-10-CM | POA: Diagnosis not present

## 2018-01-25 DIAGNOSIS — Z950 Presence of cardiac pacemaker: Secondary | ICD-10-CM | POA: Diagnosis not present

## 2018-01-25 DIAGNOSIS — Z905 Acquired absence of kidney: Secondary | ICD-10-CM | POA: Diagnosis not present

## 2018-01-25 DIAGNOSIS — I4891 Unspecified atrial fibrillation: Secondary | ICD-10-CM | POA: Diagnosis not present

## 2018-01-25 DIAGNOSIS — F329 Major depressive disorder, single episode, unspecified: Secondary | ICD-10-CM | POA: Diagnosis not present

## 2018-01-25 DIAGNOSIS — E785 Hyperlipidemia, unspecified: Secondary | ICD-10-CM | POA: Diagnosis not present

## 2018-01-25 DIAGNOSIS — F0390 Unspecified dementia without behavioral disturbance: Secondary | ICD-10-CM | POA: Diagnosis not present

## 2018-01-25 DIAGNOSIS — N390 Urinary tract infection, site not specified: Secondary | ICD-10-CM | POA: Diagnosis not present

## 2018-01-25 DIAGNOSIS — E039 Hypothyroidism, unspecified: Secondary | ICD-10-CM | POA: Diagnosis not present

## 2018-01-25 DIAGNOSIS — I10 Essential (primary) hypertension: Secondary | ICD-10-CM | POA: Diagnosis not present

## 2018-01-25 DIAGNOSIS — M6281 Muscle weakness (generalized): Secondary | ICD-10-CM | POA: Diagnosis not present

## 2018-01-25 DIAGNOSIS — D500 Iron deficiency anemia secondary to blood loss (chronic): Secondary | ICD-10-CM | POA: Diagnosis not present

## 2018-01-25 DIAGNOSIS — I480 Paroxysmal atrial fibrillation: Secondary | ICD-10-CM | POA: Diagnosis not present

## 2018-01-25 DIAGNOSIS — R5381 Other malaise: Secondary | ICD-10-CM | POA: Diagnosis not present

## 2018-01-25 DIAGNOSIS — K219 Gastro-esophageal reflux disease without esophagitis: Secondary | ICD-10-CM | POA: Diagnosis not present

## 2018-01-29 DIAGNOSIS — N390 Urinary tract infection, site not specified: Secondary | ICD-10-CM | POA: Diagnosis not present

## 2018-01-29 DIAGNOSIS — R5381 Other malaise: Secondary | ICD-10-CM | POA: Diagnosis not present

## 2018-01-29 DIAGNOSIS — R262 Difficulty in walking, not elsewhere classified: Secondary | ICD-10-CM | POA: Diagnosis not present

## 2018-01-29 DIAGNOSIS — I4891 Unspecified atrial fibrillation: Secondary | ICD-10-CM | POA: Diagnosis not present

## 2018-02-19 DIAGNOSIS — I480 Paroxysmal atrial fibrillation: Secondary | ICD-10-CM | POA: Diagnosis not present

## 2018-02-19 DIAGNOSIS — J690 Pneumonitis due to inhalation of food and vomit: Secondary | ICD-10-CM | POA: Diagnosis not present

## 2018-02-19 DIAGNOSIS — F0280 Dementia in other diseases classified elsewhere without behavioral disturbance: Secondary | ICD-10-CM | POA: Diagnosis not present

## 2018-02-19 DIAGNOSIS — Z7902 Long term (current) use of antithrombotics/antiplatelets: Secondary | ICD-10-CM | POA: Diagnosis not present

## 2018-02-19 DIAGNOSIS — Z951 Presence of aortocoronary bypass graft: Secondary | ICD-10-CM | POA: Diagnosis not present

## 2018-02-19 DIAGNOSIS — Z8551 Personal history of malignant neoplasm of bladder: Secondary | ICD-10-CM | POA: Diagnosis not present

## 2018-02-19 DIAGNOSIS — R339 Retention of urine, unspecified: Secondary | ICD-10-CM | POA: Diagnosis not present

## 2018-02-19 DIAGNOSIS — I5032 Chronic diastolic (congestive) heart failure: Secondary | ICD-10-CM | POA: Diagnosis not present

## 2018-02-19 DIAGNOSIS — K449 Diaphragmatic hernia without obstruction or gangrene: Secondary | ICD-10-CM | POA: Diagnosis not present

## 2018-02-19 DIAGNOSIS — I252 Old myocardial infarction: Secondary | ICD-10-CM | POA: Diagnosis not present

## 2018-02-19 DIAGNOSIS — I2510 Atherosclerotic heart disease of native coronary artery without angina pectoris: Secondary | ICD-10-CM | POA: Diagnosis not present

## 2018-02-19 DIAGNOSIS — F419 Anxiety disorder, unspecified: Secondary | ICD-10-CM | POA: Diagnosis not present

## 2018-02-19 DIAGNOSIS — E039 Hypothyroidism, unspecified: Secondary | ICD-10-CM | POA: Diagnosis not present

## 2018-02-20 DIAGNOSIS — F419 Anxiety disorder, unspecified: Secondary | ICD-10-CM

## 2018-02-21 ENCOUNTER — Other Ambulatory Visit: Payer: Self-pay

## 2018-02-21 NOTE — Patient Outreach (Signed)
[facility]) Care Management  02/21/2018  Cassandra Castro 1934/04/16 [id number]     Transition of Care Referral  Referral Date: 02/21/18 Referral Source: HTA Discharge Report Date of Admission: 01/25/18 Diagnosis: "UTI, muscle weakness, A-fib" Date of Discharge: 02/18/18 Facility: [facility] Insurance: HTA   Referral received. No outreach warranted at this time. Noted in chart that TOC has already been completed(02/19/18) by primary care provider office ([facility] nurse) who will refer to [facility] care mgmt if needed.     Plan: RN CM will close case at this time.    Enzo Montgomery, RN,BSN,CCM [facility] Telephonic Care Management Coordinator Direct Phone: [phone] Toll Free: [phone] Fax: [phone]

## 2018-02-24 NOTE — Progress Notes (Signed)
Cardiology Office Note:    Date:  02/25/2018   ID:  Cassandra Castro, DOB Nov 20, 1933, MRN [id number]  PCP:  Myrlene Broker, MD  Cardiologist:  Shirlee More, MD   Referring MD: Myrlene Broker, MD  ASSESSMENT:    1. Coronary artery disease involving native coronary artery of native heart with angina pectoris (HCC)   2. Paroxysmal atrial fibrillation ([place])   3. SSS (sick sinus syndrome) ([place])   4. Presence of cardiac pacemaker   5. Pure hypercholesterolemia   6. Stage 3 chronic kidney disease ([place])   7. Hypertensive heart disease without heart failure   8. On amiodarone therapy    PLAN:    In order of problems listed above:  1. Stable with remote bypass surgery this time I would not advise an ischemia evaluation of coronary angiography and if she is having no anginal discomfort continue current medical treatment 2. Stable continue low-dose amiodarone she will require a CMP and TSH every 6 months.  She will continue take clopidogrel and the family does not want to pursue anticoagulation 3. Stable she remains in sinus rhythm with a combination of amiodarone suppressant treatment and pacemaker 4. Stable pacemaker 5. Currently not on a statin I will explore at next visit lipid-lowering therapy and barriers to care 6. Stable she has plans to have BMP checked next week I will ask her PCP and also do a proBNP level 7. Mildly decompensated she will take an extra dose of her diuretic in the afternoon on Monday Wednesday and Friday 8. Good result continue low-dose amiodarone we will survey her every 6 months with liver function TSH for toxicity.  Next appointment 3 months   Medication Adjustments/Labs and Tests Ordered: Current medicines are reviewed at length with the patient today.  Concerns regarding medicines are outlined above.  Orders Placed This Encounter  Procedures  . Ambulatory referral to Cardiac Electrophysiology   Meds ordered this encounter  Medications  . furosemide  (LASIX) 40 MG tablet    Sig: Take 1 tablet (40 mg total) by mouth daily. Take 1 tablet everyday, and 2 tablets on Mon, Wed, and Fri    Dispense:  45 tablet    Refill:  3     Chief Complaint  Patient presents with  . Follow-up  . Congestive Heart Failure    History of Present Illness:    Cassandra Castro is a 84 y.o. female with a history of CAD CABG in 2010, bilateral CEA in 2008, SSS with paroxysmal  atrial fibrillation and a Biotronik pacemaker,hypertension,  hyperlipidemia and CKD  who is being seen today for the evaluation of worsening heart failure at the request of Myrlene Broker, MD.  Device check 12/19/17 with normal function and RV paced 2% with 3% AF burden.  She was recently admitted to [facility] with weakness worsening confusion on a background of dementia was presumed to have a urinary tract infection treated and improved.  Her heart failure was stable during the hospitalization atrial fibrillation was stable she remained in sinus rhythm throughout echocardiogram was performed showing an ejection fraction of 55 to 60% moderate left atrial enlargement bubble study was performed showing no evidence of shunt because of concerns initially of stroke she had a carotid duplex performed which showed a left less than 50% left internal carotid artery stenosis and absence of flow in the right vertebral artery.  Was noted at the time of discharge as she is on low-dose amiodarone 100 mg daily and was  not anticoagulated after discussion with patient's family who made decision not to accept anticoagulants.  Laboratory studies showed a normal CBC at hospital discharge creatinine 1.0 GFR 53 cc cholesterol 126 LDL 60 HDL 37 it does not appear that serum troponin or BNP level was performed.  Unfortunate as a consequence of hospitalization IV fluids her weight runs up in the range of 6 to 10 pounds family [place] close attention sodium intake increased her diuretic and she is close to baseline  although she remains short of breath with any activity.  They were unaware she had 1-2+ edema.  She has had no chest pain palpitations syncope or TIA. Past Medical History:  Diagnosis Date  . Anxiety 02/18/2015  . Atherosclerotic heart disease of native coronary artery without angina pectoris 02/14/2015  . Bladder cancer ([place]) 02/18/2015  . Essential hypertension 02/14/2015  . Hyperlipidemia 02/14/2015  . Paroxysmal atrial fibrillation ([place]) 02/14/2015  . Presence of aortocoronary bypass graft 02/14/2015  . Presence of cardiac pacemaker 02/15/2015  . SSS (sick sinus syndrome) ([place]) 12/01/2015  . Stage 3 chronic kidney disease ([place]) 04/22/2016    Past Surgical History:  Procedure Laterality Date  . BACK SURGERY    . BLADDER SURGERY    . CAROTID ENDARTERECTOMY    . CORONARY ARTERY BYPASS GRAFT    . INSERT / REPLACE / REMOVE PACEMAKER    . IR RETROGRADE PYELOGRAM    . NEPHROURETERECTOMY    . TRANSURETHRAL RESECTION OF BLADDER      Current Medications: Current Meds  Medication Sig  . albuterol (PROVENTIL HFA;VENTOLIN HFA) 108 (90 Base) MCG/ACT inhaler Inhale 2 puffs into the lungs every 4 (four) hours as needed.  . ALPRAZolam (XANAX) 0.5 MG tablet Take 1 tablet by mouth 3 (three) times daily as needed for anxiety.   Marland Kitchen amiodarone (PACERONE) 100 MG tablet Take 0.5 tablets by mouth daily.   Marland Kitchen amitriptyline (ELAVIL) 50 MG tablet Take 50 mg by mouth at bedtime.  Marland Kitchen atorvastatin (LIPITOR) 40 MG tablet Take 1 tablet by mouth at bedtime.  Marland Kitchen CARTIA XT 120 MG 24 hr capsule Take 120 mg by mouth daily.  . clopidogrel (PLAVIX) 75 MG tablet Take 37.5 mg by mouth daily.   . furosemide (LASIX) 40 MG tablet Take 1 tablet (40 mg total) by mouth daily. Take 1 tablet everyday, and 2 tablets on Mon, Wed, and Fri  . gabapentin (NEURONTIN) 800 MG tablet Take 1 tablet by mouth 4 (four) times daily.  Marland Kitchen levothyroxine (SYNTHROID, LEVOTHROID) 75 MCG tablet Take 1 tablet by mouth daily.  . Multiple Vitamin  (MULTI-VITAMINS) TABS Take 1 tablet by mouth daily.  Marland Kitchen omeprazole (PRILOSEC) 20 MG capsule Take 1 capsule by mouth daily.  . pantoprazole (PROTONIX) 40 MG tablet Take 1 tablet by mouth daily.  . polyethylene glycol (MIRALAX / GLYCOLAX) packet Take 17 g by mouth daily.  . potassium chloride SA (K-DUR,KLOR-CON) 20 MEQ tablet Take 1 tablet by mouth daily.  . [DISCONTINUED] furosemide (LASIX) 40 MG tablet Take 1 tablet by mouth daily.     Allergies:   Amoxicillin; Amoxicillin-pot clavulanate; Atorvastatin; Clavulanic acid; Hydrocodone; Codeine; Latex; Promethazine; Sulfa antibiotics; Tape; and Tramadol   Social History   Socioeconomic History  . Marital status: Married    Spouse name: Not on file  . Number of children: Not on file  . Years of education: Not on file  . Highest education level: Not on file  Occupational History  . Not on file  Social Needs  .  Financial resource strain: Not on file  . Food insecurity:    Worry: Not on file    Inability: Not on file  . Transportation needs:    Medical: Not on file    Non-medical: Not on file  Tobacco Use  . Smoking status: Never Smoker  . Smokeless tobacco: Never Used  Substance and Sexual Activity  . Alcohol use: Not Currently  . Drug use: Never  . Sexual activity: Not on file  Lifestyle  . Physical activity:    Days per week: Not on file    Minutes per session: Not on file  . Stress: Not on file  Relationships  . Social connections:    Talks on phone: Not on file    Gets together: Not on file    Attends religious service: Not on file    Active member of club or organization: Not on file    Attends meetings of clubs or organizations: Not on file    Relationship status: Not on file  Other Topics Concern  . Not on file  Social History Narrative  . Not on file     Family History: The patient's family history includes Diabetes in her maternal grandfather; Heart disease in her maternal grandfather; Huntington's disease in  her mother.  ROS:   ROS Please see the history of present illness.     All other systems reviewed and are negative.  EKGs/Labs/Other Studies Reviewed:    The following studies were reviewed today:   EKG:  EKG is  ordered today.  The ekg ordered today demonstrates normal dual-chamber pacemaker function a paced with intrinsic AV nodal conduction and normal QRS  Recent Labs:   01/10/18  With Cr 1.52 GFR 32 cc/min, OW normal, TSH 6.67  Chol 204 HDL 52 and LDL 143 Hgb 13.1 No results found for requested labs within last 8760 hours.  Recent Lipid Panel No results found for: CHOL, TRIG, HDL, CHOLHDL, VLDL, LDLCALC, LDLDIRECT  Physical Exam:    VS:  BP 130/64   Pulse 80   Ht 5\' 1"  (1.549 m)   Wt 179 lb (81.2 kg) Comment: Reported  SpO2 97%   BMI 33.82 kg/m     Wt Readings from Last 3 Encounters:  02/25/18 179 lb (81.2 kg)     GEN: She is short of breath coming into the room she appears frail well nourished, well developed in no acute distress HEENT: Normal NECK: No JVD; No carotid bruits LYMPHATICS: No lymphadenopathy CARDIAC: RRR, no murmurs, rubs, gallops RESPIRATORY:  Clear to auscultation without rales, wheezing or rhonchi  ABDOMEN: Soft, non-tender, non-distended MUSCULOSKELETAL:  No edema; No deformity  SKIN: Warm and dry NEUROLOGIC:  Alert and oriented x 3 PSYCHIATRIC:  Normal affect     Signed, Shirlee More, MD  02/25/2018 3:23 PM    [facility]

## 2018-02-25 ENCOUNTER — Ambulatory Visit (INDEPENDENT_AMBULATORY_CARE_PROVIDER_SITE_OTHER): Payer: PPO | Admitting: Cardiology

## 2018-02-25 ENCOUNTER — Encounter: Payer: Self-pay | Admitting: Cardiology

## 2018-02-25 VITALS — BP 130/64 | HR 80 | Ht 61.0 in | Wt 179.0 lb

## 2018-02-25 DIAGNOSIS — E7800 Pure hypercholesterolemia, unspecified: Secondary | ICD-10-CM

## 2018-02-25 DIAGNOSIS — Z79899 Other long term (current) drug therapy: Secondary | ICD-10-CM | POA: Diagnosis not present

## 2018-02-25 DIAGNOSIS — N183 Chronic kidney disease, stage 3 unspecified: Secondary | ICD-10-CM

## 2018-02-25 DIAGNOSIS — I119 Hypertensive heart disease without heart failure: Secondary | ICD-10-CM

## 2018-02-25 DIAGNOSIS — Z950 Presence of cardiac pacemaker: Secondary | ICD-10-CM

## 2018-02-25 DIAGNOSIS — I495 Sick sinus syndrome: Secondary | ICD-10-CM | POA: Diagnosis not present

## 2018-02-25 DIAGNOSIS — I480 Paroxysmal atrial fibrillation: Secondary | ICD-10-CM

## 2018-02-25 DIAGNOSIS — I25119 Atherosclerotic heart disease of native coronary artery with unspecified angina pectoris: Secondary | ICD-10-CM

## 2018-02-25 MED ORDER — FUROSEMIDE 40 MG PO TABS: 40.0000 mg | ORAL_TABLET | Freq: Every day | ORAL | 3 refills | Status: DC

## 2018-02-25 NOTE — Patient Instructions (Signed)
Medication Instructions:  Your physician has recommended you make the following change in your medication:   INCREASE fuosemide 40mg  to one tablet everyday except take 1 tablet two times daily on Mon, Wed, and Fri    Labwork: NONE  Testing/Procedures: NONE  Follow-Up: Your physician wants you to follow-up in: 3 months. You will receive a reminder letter in the mail two months in advance. If you don't receive a letter, please call our office to schedule the follow-up appointment.  You will be set up for a consult with Dr Curt Bears for pacemaker check.   Any Other Special Instructions Will Be Listed Below (If Applicable).     If you need a refill on your cardiac medications before your next appointment, please call your pharmacy.

## 2018-02-26 NOTE — Addendum Note (Signed)
Addended by: Austin Miles on: 02/26/2018 01:22 PM   Modules accepted: Orders

## 2018-03-03 ENCOUNTER — Other Ambulatory Visit: Payer: Self-pay | Admitting: Cardiology

## 2018-03-03 NOTE — Telephone Encounter (Signed)
Patient has been in the Nursing home for rehab and she needs the following meds refilled:   *STAT* If patient is at the pharmacy, call can be transferred to refill team.   1. Which medications need to be refilled? (please list name of each medication and dose if known: Cartia  XT 120/24 HR CAP 1 daily   2. Which pharmacy/location (including street and city if local pharmacy) is medication to be sent to?[facility] [place]  3. Do they need a 30 day or 90 day supply? 30   *STAT* If patient is at the pharmacy, call can be transferred to refill team.   1. Which medications need to be refilled? (please list name of each medication and dose if known) Rosuvastatin 20mg   1 daily   2. Which pharmacy/location (including street and city if local pharmacy) is medication to be sent to?[facility] [place]  3. Do they need a 30 day or 90 day supply? [place]

## 2018-03-04 DIAGNOSIS — R339 Retention of urine, unspecified: Secondary | ICD-10-CM | POA: Diagnosis not present

## 2018-03-04 DIAGNOSIS — Z7902 Long term (current) use of antithrombotics/antiplatelets: Secondary | ICD-10-CM | POA: Diagnosis not present

## 2018-03-04 DIAGNOSIS — F0280 Dementia in other diseases classified elsewhere without behavioral disturbance: Secondary | ICD-10-CM | POA: Diagnosis not present

## 2018-03-04 DIAGNOSIS — E039 Hypothyroidism, unspecified: Secondary | ICD-10-CM | POA: Diagnosis not present

## 2018-03-04 DIAGNOSIS — J690 Pneumonitis due to inhalation of food and vomit: Secondary | ICD-10-CM | POA: Diagnosis not present

## 2018-03-04 DIAGNOSIS — F419 Anxiety disorder, unspecified: Secondary | ICD-10-CM | POA: Diagnosis not present

## 2018-03-04 DIAGNOSIS — I480 Paroxysmal atrial fibrillation: Secondary | ICD-10-CM | POA: Diagnosis not present

## 2018-03-04 DIAGNOSIS — I5032 Chronic diastolic (congestive) heart failure: Secondary | ICD-10-CM | POA: Diagnosis not present

## 2018-03-04 DIAGNOSIS — Z951 Presence of aortocoronary bypass graft: Secondary | ICD-10-CM | POA: Diagnosis not present

## 2018-03-04 DIAGNOSIS — I2510 Atherosclerotic heart disease of native coronary artery without angina pectoris: Secondary | ICD-10-CM | POA: Diagnosis not present

## 2018-03-04 DIAGNOSIS — Z8551 Personal history of malignant neoplasm of bladder: Secondary | ICD-10-CM | POA: Diagnosis not present

## 2018-03-04 DIAGNOSIS — K449 Diaphragmatic hernia without obstruction or gangrene: Secondary | ICD-10-CM | POA: Diagnosis not present

## 2018-03-04 DIAGNOSIS — I252 Old myocardial infarction: Secondary | ICD-10-CM | POA: Diagnosis not present

## 2018-03-04 NOTE — Telephone Encounter (Signed)
Left message for patient to return call to verify medications

## 2018-03-05 ENCOUNTER — Other Ambulatory Visit: Payer: Self-pay | Admitting: Emergency Medicine

## 2018-03-05 MED ORDER — ATORVASTATIN CALCIUM 40 MG PO TABS: 40.0000 mg | ORAL_TABLET | Freq: Every day | ORAL | 3 refills | Status: DC

## 2018-03-05 NOTE — Telephone Encounter (Signed)
Confirmed with patient's daughter that she previously had muscle pains with lipitor. However she hasn't had them recently ad will notify us if they return.

## 2018-03-05 NOTE — Telephone Encounter (Signed)
Verified with patient's daughter that patient needs refill of lipitor 40 mg daily. Will refill for patient.

## 2018-03-14 DIAGNOSIS — Z951 Presence of aortocoronary bypass graft: Secondary | ICD-10-CM | POA: Diagnosis not present

## 2018-03-14 DIAGNOSIS — E039 Hypothyroidism, unspecified: Secondary | ICD-10-CM | POA: Diagnosis not present

## 2018-03-14 DIAGNOSIS — F419 Anxiety disorder, unspecified: Secondary | ICD-10-CM | POA: Diagnosis not present

## 2018-03-14 DIAGNOSIS — D500 Iron deficiency anemia secondary to blood loss (chronic): Secondary | ICD-10-CM | POA: Diagnosis not present

## 2018-03-14 DIAGNOSIS — M810 Age-related osteoporosis without current pathological fracture: Secondary | ICD-10-CM | POA: Diagnosis not present

## 2018-03-14 DIAGNOSIS — I110 Hypertensive heart disease with heart failure: Secondary | ICD-10-CM | POA: Diagnosis not present

## 2018-03-14 DIAGNOSIS — J690 Pneumonitis due to inhalation of food and vomit: Secondary | ICD-10-CM | POA: Diagnosis not present

## 2018-03-14 DIAGNOSIS — Z7902 Long term (current) use of antithrombotics/antiplatelets: Secondary | ICD-10-CM | POA: Diagnosis not present

## 2018-03-14 DIAGNOSIS — J45909 Unspecified asthma, uncomplicated: Secondary | ICD-10-CM | POA: Diagnosis not present

## 2018-03-14 DIAGNOSIS — I2510 Atherosclerotic heart disease of native coronary artery without angina pectoris: Secondary | ICD-10-CM | POA: Diagnosis not present

## 2018-03-14 DIAGNOSIS — I5032 Chronic diastolic (congestive) heart failure: Secondary | ICD-10-CM | POA: Diagnosis not present

## 2018-03-14 DIAGNOSIS — F0280 Dementia in other diseases classified elsewhere without behavioral disturbance: Secondary | ICD-10-CM | POA: Diagnosis not present

## 2018-03-14 DIAGNOSIS — Z8551 Personal history of malignant neoplasm of bladder: Secondary | ICD-10-CM | POA: Diagnosis not present

## 2018-03-14 DIAGNOSIS — Z87440 Personal history of urinary (tract) infections: Secondary | ICD-10-CM | POA: Diagnosis not present

## 2018-03-14 DIAGNOSIS — I480 Paroxysmal atrial fibrillation: Secondary | ICD-10-CM | POA: Diagnosis not present

## 2018-03-14 DIAGNOSIS — Z8673 Personal history of transient ischemic attack (TIA), and cerebral infarction without residual deficits: Secondary | ICD-10-CM | POA: Diagnosis not present

## 2018-03-14 DIAGNOSIS — F329 Major depressive disorder, single episode, unspecified: Secondary | ICD-10-CM | POA: Diagnosis not present

## 2018-03-14 DIAGNOSIS — I252 Old myocardial infarction: Secondary | ICD-10-CM | POA: Diagnosis not present

## 2018-03-14 DIAGNOSIS — Z9181 History of falling: Secondary | ICD-10-CM | POA: Diagnosis not present

## 2018-03-21 DIAGNOSIS — Z23 Encounter for immunization: Secondary | ICD-10-CM | POA: Diagnosis not present

## 2018-03-21 DIAGNOSIS — B373 Candidiasis of vulva and vagina: Secondary | ICD-10-CM | POA: Diagnosis not present

## 2018-03-21 DIAGNOSIS — I480 Paroxysmal atrial fibrillation: Secondary | ICD-10-CM | POA: Diagnosis not present

## 2018-03-21 DIAGNOSIS — R300 Dysuria: Secondary | ICD-10-CM | POA: Diagnosis not present

## 2018-03-21 DIAGNOSIS — Z950 Presence of cardiac pacemaker: Secondary | ICD-10-CM | POA: Diagnosis not present

## 2018-03-21 DIAGNOSIS — F419 Anxiety disorder, unspecified: Secondary | ICD-10-CM | POA: Diagnosis not present

## 2018-05-07 DIAGNOSIS — R05 Cough: Secondary | ICD-10-CM | POA: Diagnosis not present

## 2018-05-07 DIAGNOSIS — R5383 Other fatigue: Secondary | ICD-10-CM | POA: Diagnosis not present

## 2018-05-07 DIAGNOSIS — I10 Essential (primary) hypertension: Secondary | ICD-10-CM | POA: Diagnosis not present

## 2018-05-07 DIAGNOSIS — R0602 Shortness of breath: Secondary | ICD-10-CM | POA: Diagnosis not present

## 2018-05-07 DIAGNOSIS — R609 Edema, unspecified: Secondary | ICD-10-CM | POA: Diagnosis not present

## 2018-05-07 DIAGNOSIS — E039 Hypothyroidism, unspecified: Secondary | ICD-10-CM | POA: Diagnosis not present

## 2018-05-07 DIAGNOSIS — R5381 Other malaise: Secondary | ICD-10-CM | POA: Diagnosis not present

## 2018-05-07 DIAGNOSIS — R4182 Altered mental status, unspecified: Secondary | ICD-10-CM | POA: Diagnosis not present

## 2018-05-21 DIAGNOSIS — R531 Weakness: Secondary | ICD-10-CM | POA: Diagnosis not present

## 2018-05-21 DIAGNOSIS — E860 Dehydration: Secondary | ICD-10-CM | POA: Diagnosis not present

## 2018-05-21 DIAGNOSIS — I10 Essential (primary) hypertension: Secondary | ICD-10-CM | POA: Diagnosis not present

## 2018-05-21 DIAGNOSIS — R4182 Altered mental status, unspecified: Secondary | ICD-10-CM | POA: Diagnosis not present

## 2018-05-21 DIAGNOSIS — Z8673 Personal history of transient ischemic attack (TIA), and cerebral infarction without residual deficits: Secondary | ICD-10-CM | POA: Diagnosis not present

## 2018-05-21 DIAGNOSIS — R410 Disorientation, unspecified: Secondary | ICD-10-CM | POA: Diagnosis not present

## 2018-05-21 DIAGNOSIS — R4789 Other speech disturbances: Secondary | ICD-10-CM | POA: Diagnosis not present

## 2018-05-27 ENCOUNTER — Other Ambulatory Visit: Payer: Self-pay | Admitting: Cardiology

## 2018-05-27 MED ORDER — CLOPIDOGREL BISULFATE 75 MG PO TABS: 37.5000 mg | ORAL_TABLET | Freq: Every day | ORAL | 2 refills | Status: DC

## 2018-05-27 NOTE — Telephone Encounter (Signed)
Wants Plavix called to [facility] [place]

## 2018-05-27 NOTE — Telephone Encounter (Signed)
Plavix 37.5 mg daily refilled.

## 2018-05-30 DIAGNOSIS — Z905 Acquired absence of kidney: Secondary | ICD-10-CM | POA: Diagnosis not present

## 2018-05-30 DIAGNOSIS — I2510 Atherosclerotic heart disease of native coronary artery without angina pectoris: Secondary | ICD-10-CM | POA: Diagnosis not present

## 2018-05-30 DIAGNOSIS — J449 Chronic obstructive pulmonary disease, unspecified: Secondary | ICD-10-CM | POA: Diagnosis not present

## 2018-05-30 DIAGNOSIS — I160 Hypertensive urgency: Secondary | ICD-10-CM | POA: Diagnosis not present

## 2018-05-30 DIAGNOSIS — Z8551 Personal history of malignant neoplasm of bladder: Secondary | ICD-10-CM | POA: Diagnosis not present

## 2018-05-30 DIAGNOSIS — R739 Hyperglycemia, unspecified: Secondary | ICD-10-CM | POA: Diagnosis not present

## 2018-05-30 DIAGNOSIS — Z8673 Personal history of transient ischemic attack (TIA), and cerebral infarction without residual deficits: Secondary | ICD-10-CM | POA: Diagnosis not present

## 2018-05-30 DIAGNOSIS — R531 Weakness: Secondary | ICD-10-CM

## 2018-05-30 DIAGNOSIS — I110 Hypertensive heart disease with heart failure: Secondary | ICD-10-CM | POA: Diagnosis not present

## 2018-05-30 DIAGNOSIS — B9689 Other specified bacterial agents as the cause of diseases classified elsewhere: Secondary | ICD-10-CM | POA: Diagnosis not present

## 2018-05-30 DIAGNOSIS — N179 Acute kidney failure, unspecified: Secondary | ICD-10-CM

## 2018-05-30 DIAGNOSIS — R9431 Abnormal electrocardiogram [ECG] [EKG]: Secondary | ICD-10-CM | POA: Diagnosis not present

## 2018-05-30 DIAGNOSIS — Z7902 Long term (current) use of antithrombotics/antiplatelets: Secondary | ICD-10-CM | POA: Diagnosis not present

## 2018-05-30 DIAGNOSIS — N3000 Acute cystitis without hematuria: Secondary | ICD-10-CM | POA: Diagnosis not present

## 2018-05-30 DIAGNOSIS — I10 Essential (primary) hypertension: Secondary | ICD-10-CM

## 2018-05-30 DIAGNOSIS — E039 Hypothyroidism, unspecified: Secondary | ICD-10-CM

## 2018-05-30 DIAGNOSIS — I5032 Chronic diastolic (congestive) heart failure: Secondary | ICD-10-CM

## 2018-05-30 DIAGNOSIS — Z9181 History of falling: Secondary | ICD-10-CM | POA: Diagnosis not present

## 2018-05-30 DIAGNOSIS — R410 Disorientation, unspecified: Secondary | ICD-10-CM | POA: Diagnosis not present

## 2018-05-30 DIAGNOSIS — F0390 Unspecified dementia without behavioral disturbance: Secondary | ICD-10-CM | POA: Diagnosis not present

## 2018-05-30 DIAGNOSIS — G9341 Metabolic encephalopathy: Secondary | ICD-10-CM | POA: Diagnosis not present

## 2018-05-30 DIAGNOSIS — R52 Pain, unspecified: Secondary | ICD-10-CM | POA: Diagnosis not present

## 2018-05-30 DIAGNOSIS — I4891 Unspecified atrial fibrillation: Secondary | ICD-10-CM | POA: Diagnosis not present

## 2018-05-30 DIAGNOSIS — E1165 Type 2 diabetes mellitus with hyperglycemia: Secondary | ICD-10-CM | POA: Diagnosis not present

## 2018-05-30 DIAGNOSIS — R4182 Altered mental status, unspecified: Secondary | ICD-10-CM | POA: Diagnosis not present

## 2018-05-30 DIAGNOSIS — E785 Hyperlipidemia, unspecified: Secondary | ICD-10-CM

## 2018-05-30 DIAGNOSIS — R262 Difficulty in walking, not elsewhere classified: Secondary | ICD-10-CM

## 2018-05-30 DIAGNOSIS — J45909 Unspecified asthma, uncomplicated: Secondary | ICD-10-CM

## 2018-05-30 DIAGNOSIS — R404 Transient alteration of awareness: Secondary | ICD-10-CM | POA: Diagnosis not present

## 2018-05-30 DIAGNOSIS — N390 Urinary tract infection, site not specified: Secondary | ICD-10-CM

## 2018-05-31 DIAGNOSIS — G9341 Metabolic encephalopathy: Secondary | ICD-10-CM

## 2018-06-02 ENCOUNTER — Encounter: Payer: PPO | Admitting: Cardiology

## 2018-06-03 ENCOUNTER — Other Ambulatory Visit: Payer: Self-pay

## 2018-06-03 DIAGNOSIS — F0390 Unspecified dementia without behavioral disturbance: Secondary | ICD-10-CM | POA: Diagnosis not present

## 2018-06-03 DIAGNOSIS — E039 Hypothyroidism, unspecified: Secondary | ICD-10-CM | POA: Diagnosis not present

## 2018-06-03 DIAGNOSIS — J449 Chronic obstructive pulmonary disease, unspecified: Secondary | ICD-10-CM | POA: Diagnosis not present

## 2018-06-03 DIAGNOSIS — F419 Anxiety disorder, unspecified: Secondary | ICD-10-CM | POA: Diagnosis not present

## 2018-06-03 DIAGNOSIS — M1991 Primary osteoarthritis, unspecified site: Secondary | ICD-10-CM | POA: Diagnosis not present

## 2018-06-03 DIAGNOSIS — I2510 Atherosclerotic heart disease of native coronary artery without angina pectoris: Secondary | ICD-10-CM | POA: Diagnosis not present

## 2018-06-03 DIAGNOSIS — Z85520 Personal history of malignant carcinoid tumor of kidney: Secondary | ICD-10-CM | POA: Diagnosis not present

## 2018-06-03 DIAGNOSIS — K219 Gastro-esophageal reflux disease without esophagitis: Secondary | ICD-10-CM | POA: Diagnosis not present

## 2018-06-03 DIAGNOSIS — Z951 Presence of aortocoronary bypass graft: Secondary | ICD-10-CM | POA: Diagnosis not present

## 2018-06-03 DIAGNOSIS — N3000 Acute cystitis without hematuria: Secondary | ICD-10-CM | POA: Diagnosis not present

## 2018-06-03 DIAGNOSIS — Z7902 Long term (current) use of antithrombotics/antiplatelets: Secondary | ICD-10-CM | POA: Diagnosis not present

## 2018-06-03 DIAGNOSIS — G9341 Metabolic encephalopathy: Secondary | ICD-10-CM | POA: Diagnosis not present

## 2018-06-03 DIAGNOSIS — I110 Hypertensive heart disease with heart failure: Secondary | ICD-10-CM | POA: Diagnosis not present

## 2018-06-03 DIAGNOSIS — Z950 Presence of cardiac pacemaker: Secondary | ICD-10-CM | POA: Diagnosis not present

## 2018-06-03 DIAGNOSIS — Z905 Acquired absence of kidney: Secondary | ICD-10-CM | POA: Diagnosis not present

## 2018-06-03 DIAGNOSIS — E785 Hyperlipidemia, unspecified: Secondary | ICD-10-CM | POA: Diagnosis not present

## 2018-06-03 DIAGNOSIS — Z7951 Long term (current) use of inhaled steroids: Secondary | ICD-10-CM | POA: Diagnosis not present

## 2018-06-03 DIAGNOSIS — I5032 Chronic diastolic (congestive) heart failure: Secondary | ICD-10-CM | POA: Diagnosis not present

## 2018-06-03 DIAGNOSIS — Z8673 Personal history of transient ischemic attack (TIA), and cerebral infarction without residual deficits: Secondary | ICD-10-CM | POA: Diagnosis not present

## 2018-06-03 DIAGNOSIS — Z85528 Personal history of other malignant neoplasm of kidney: Secondary | ICD-10-CM | POA: Diagnosis not present

## 2018-06-03 DIAGNOSIS — Z9181 History of falling: Secondary | ICD-10-CM | POA: Diagnosis not present

## 2018-06-03 DIAGNOSIS — R739 Hyperglycemia, unspecified: Secondary | ICD-10-CM | POA: Diagnosis not present

## 2018-06-03 DIAGNOSIS — I480 Paroxysmal atrial fibrillation: Secondary | ICD-10-CM | POA: Diagnosis not present

## 2018-06-03 DIAGNOSIS — I252 Old myocardial infarction: Secondary | ICD-10-CM | POA: Diagnosis not present

## 2018-06-03 NOTE — Patient Outreach (Signed)
Screening:  Discharged from [facility] on 06/01/2018.  CHF CKD  Placed call to patient and spoke with daughter who states she handles all medical stuff. Reports patient has dementia.   Explained reason for call. Daughter reports home health nurse came today and patient is feeling better. Reviewed many admission for heart failure and daughter reports " we are working on her weight"   Daughter reports weight of 185 pounds today. Reports patient follow low salt diet.    Offered to come to see patient to complete assessment of needs and assist with heart failure education. Daughter reports she would like to wait for 3 weeks to decide.  At this point, declined home visit and decline acceptance into program.  PLAN: Daughter request call back in 3 weeks.  Tomasa Rand, RN, BSN, CEN [facility] [employer] [phone]

## 2018-06-16 DIAGNOSIS — R7309 Other abnormal glucose: Secondary | ICD-10-CM | POA: Diagnosis not present

## 2018-06-16 DIAGNOSIS — F419 Anxiety disorder, unspecified: Secondary | ICD-10-CM | POA: Diagnosis not present

## 2018-06-16 DIAGNOSIS — R531 Weakness: Secondary | ICD-10-CM | POA: Diagnosis not present

## 2018-06-16 DIAGNOSIS — G609 Hereditary and idiopathic neuropathy, unspecified: Secondary | ICD-10-CM | POA: Diagnosis not present

## 2018-06-16 DIAGNOSIS — I10 Essential (primary) hypertension: Secondary | ICD-10-CM | POA: Diagnosis not present

## 2018-06-23 ENCOUNTER — Other Ambulatory Visit: Payer: Self-pay

## 2018-06-23 NOTE — Patient Outreach (Signed)
Telephone assessment 3 week follow up:  Placed call to patient and spoke with daughter Burnett Sheng. Explained reason for call and daughter has accepted program. Offered home visit for 06/27/2018 and daughter has accepted. Confirmed address and provided my contact information.  PLAN: initial home visit on 06/27/2018 Tomasa Rand, RN, BSN, CEN [facility] [employer] [phone]

## 2018-06-26 DIAGNOSIS — Z950 Presence of cardiac pacemaker: Secondary | ICD-10-CM | POA: Diagnosis not present

## 2018-06-27 ENCOUNTER — Other Ambulatory Visit: Payer: Self-pay

## 2018-06-27 NOTE — Patient Outreach (Signed)
[facility]) Care Management   06/27/2018  Cassandra Castro [birth year] [id number]  Cassandra Castro is an 85 y.o. female Granddaughter present for home visit. Arrived for home visit and explained [facility] program. Patient accepts program.  Subjective: Patient/granddaughter report last admission was for a TIA.  Reports patient is active with [facility] health for nursing and PT. Reports she is waiting for the arrival of a hospital bed. Granddaughter is caregiver and is a Psychologist, counselling and is caring for patient. Patient is currently weighing daily, following a low salt diet and taking all medications as prescribed. Granddaughter manages medications and uses a pill planner box.  Granddaughter reports need for ramp and transportation.   Objective:  Awake and alert. Sitting in lift chair. Ambulated to bathroom with walker without difficulty. Walker appears low to me but PT has advised the walker is adjusted to the correct height. Patient is oriented to everything except day of the week. Feels this is because she does not go out of the house daily. Today's Vitals   06/27/18 1430 06/27/18 1435  BP: 132/80   Pulse: 77   Resp: 18   SpO2: 97%   Weight: 180 lb (81.6 kg)   Height: 1.549 m (5\' 1" )   PainSc:  0-No pain   Review of Systems  Constitutional: Negative.   HENT: Positive for hearing loss.   Eyes: Negative.   Respiratory: Positive for cough and shortness of breath.   Cardiovascular: Positive for leg swelling.       Left leg more than right.  Gastrointestinal:       Hernia for 8 months to the right upper abdomen.  Genitourinary:       Recent UTI  Musculoskeletal: Positive for back pain and joint pain.  Endo/Heme/Allergies: Bruises/bleeds easily.  Psychiatric/Behavioral: Positive for memory loss. The patient is nervous/anxious.     Physical Exam  Constitutional: She is oriented to person, place, and time. She appears well-developed and well-nourished.  Cardiovascular:  Normal rate, regular rhythm, normal heart sounds and intact distal pulses.  Respiratory: Effort normal and breath sounds normal.  GI: Soft. Bowel sounds are normal.  Right upper quad hernia noted to be soft to touch.   Musculoskeletal: Normal range of motion.        General: Edema present.     Comments: 1 plus edema to the right leg, 2 plus edema of the left leg.   Neurological: She is alert and oriented to person, place, and time.  Skin: Skin is warm and dry.  Psychiatric: She has a normal mood and affect. Her behavior is normal. Judgment and thought content normal.    Encounter Medications:   Outpatient Encounter Medications as of 06/27/2018  Medication Sig  . albuterol (PROVENTIL HFA;VENTOLIN HFA) 108 (90 Base) MCG/ACT inhaler Inhale 2 puffs into the lungs every 4 (four) hours as needed.  . ALPRAZolam (XANAX) 0.5 MG tablet Take 1 tablet by mouth 3 (three) times daily as needed for anxiety.   Marland Kitchen amiodarone (PACERONE) 100 MG tablet Take 0.5 tablets by mouth daily.   Marland Kitchen amitriptyline (ELAVIL) 50 MG tablet Take 50 mg by mouth at bedtime.  Marland Kitchen CARTIA XT 120 MG 24 hr capsule Take 120 mg by mouth daily.  . clopidogrel (PLAVIX) 75 MG tablet Take 0.5 tablets (37.5 mg total) by mouth daily.  . furosemide (LASIX) 40 MG tablet Take 1 tablet (40 mg total) by mouth daily. Take 1 tablet everyday, and 2 tablets on Mon, Wed, and Fri  .  gabapentin (NEURONTIN) 800 MG tablet Take 1 tablet by mouth 4 (four) times daily.  Marland Kitchen levothyroxine (SYNTHROID, LEVOTHROID) 75 MCG tablet Take 1 tablet by mouth daily.  . Multiple Vitamin (MULTI-VITAMINS) TABS Take 1 tablet by mouth daily.  . polyethylene glycol (MIRALAX / GLYCOLAX) packet Take 17 g by mouth daily.  . potassium chloride SA (K-DUR,KLOR-CON) 20 MEQ tablet Take 1 tablet by mouth daily.  . rosuvastatin (CRESTOR) 20 MG tablet Take 20 mg by mouth daily.  . [DISCONTINUED] atorvastatin (LIPITOR) 40 MG tablet Take 1 tablet (40 mg total) by mouth at bedtime.  .  [DISCONTINUED] omeprazole (PRILOSEC) 20 MG capsule Take 1 capsule by mouth daily.  . [DISCONTINUED] pantoprazole (PROTONIX) 40 MG tablet Take 1 tablet by mouth daily.   No facility-administered encounter medications on file as of 06/27/2018.     Functional Status:   In your present state of health, do you have any difficulty performing the following activities: 06/27/2018  Hearing? Y  Comment right ear difficulty hearing  Vision? Y  Comment reading glasses  Difficulty concentrating or making decisions? Y  Comment unable to verbalize day of the week.   Walking or climbing stairs? Y  Dressing or bathing? Y  Doing errands, shopping? Y  Preparing Food and eating ? Y  Comment family prepares all food.   Using the Toilet? N  In the past six months, have you accidently leaked urine? Y  Do you have problems with loss of bowel control? Y  Managing your Medications? Y  Comment granddaughter manages medications  Managing your Finances? N  Housekeeping or managing your Housekeeping? Y  Comment families does house keeping  Some recent data might be hidden    Fall/Depression Screening:    Fall Risk  06/27/2018  Falls in the past year? 1  Number falls in past yr: 1  Injury with Fall? 0  Risk for fall due to : History of fall(s);Impaired balance/gait   PHQ 2/9 Scores 06/27/2018  PHQ - 2 Score 0    Assessment:  (1) Reviewed [facility] program. Provided new patient welcome packet. Provided 24 hour nurse line magnet, [place] calendar and my contact card. Consent reviewed and signed. (2) heart failure: managing well with help of granddaughter. Weighing daily and recording, following low salt diet, and taking all medications as prescribed.  (3) difficulty with ambulation due to generalized weakness. Using walker and is active with home health PT. Needs hospital pending which is pending delivery. Difficulty getting out of the house and request help ramp and transportation. (4) TIA's   Plan:  (1) consent  scanned into chart. (2) encouraged patient to continue to take all medications as prescribed. Provided heart failure packet and reviewed low salt diet. Reviewed heart failure zones and when to call MD. (3) Reviewed importance of staying active. Encouraged patient to continue home health exercises. Referral placed for [facility] social worker. (4) reviewed signs of MI and stroke. Encouraged patient to get emergency care for acute symptoms.  No nursing needs identified at this home visit. Will plan to close to nursing and refer to [facility] social worker. Encouraged granddaughter to call in the future for needs.   Tomasa Rand, RN, BSN, CEN [facility] [employer] [phone]

## 2018-07-03 ENCOUNTER — Other Ambulatory Visit: Payer: Self-pay

## 2018-07-03 NOTE — Patient Outreach (Signed)
[facility]) Care Management  07/03/2018  Cassandra Castro [birth year] [id number]  BSW placed a call to the patients home number and spoke with daughter Varney Biles who is listed on patients consent form. Patient HIPAA identifiers confirmed. BSW introduced self and the reason for today's call, indicating the patient had been referred for assistance with transportation and wheelchair ramp resources. Butch Penny indicated she does not currently live with the patient and feels her niece (the patients caregiver) may have identified these resource needs.  BSW discussed transportation resource [facility] with Butch Penny. BSW further discussed [place] Location manager [facility]) as a Scientist, research (life sciences). Butch Penny is agreeable to BSW sending information on both programs. BSW explained that ramps can take some time in getting built when using a resource such as [place] [facility]. Butch Penny stated understanding and reports she may request assistance from a few family friends with Architect.  Plan: BSW to follow up with Butch Penny in the next 4 weeks to confirm receipt of resources. Butch Penny is aware she may call sooner if assistance is needed prior to the next scheduled call.  Daneen Schick, BSW, CDP [facility] [phone]

## 2018-07-21 DIAGNOSIS — R310 Gross hematuria: Secondary | ICD-10-CM | POA: Diagnosis not present

## 2018-07-21 DIAGNOSIS — R399 Unspecified symptoms and signs involving the genitourinary system: Secondary | ICD-10-CM | POA: Diagnosis not present

## 2018-07-22 ENCOUNTER — Other Ambulatory Visit: Payer: Self-pay | Admitting: Cardiology

## 2018-07-23 DIAGNOSIS — Z905 Acquired absence of kidney: Secondary | ICD-10-CM | POA: Diagnosis not present

## 2018-07-23 DIAGNOSIS — R310 Gross hematuria: Secondary | ICD-10-CM | POA: Diagnosis not present

## 2018-07-23 DIAGNOSIS — Z8551 Personal history of malignant neoplasm of bladder: Secondary | ICD-10-CM | POA: Diagnosis not present

## 2018-07-23 DIAGNOSIS — Z906 Acquired absence of other parts of urinary tract: Secondary | ICD-10-CM | POA: Diagnosis not present

## 2018-07-24 ENCOUNTER — Ambulatory Visit: Payer: Self-pay

## 2018-07-30 DIAGNOSIS — R29898 Other symptoms and signs involving the musculoskeletal system: Secondary | ICD-10-CM | POA: Diagnosis not present

## 2018-07-30 DIAGNOSIS — M1990 Unspecified osteoarthritis, unspecified site: Secondary | ICD-10-CM | POA: Diagnosis not present

## 2018-07-31 ENCOUNTER — Ambulatory Visit: Payer: Self-pay

## 2018-07-31 ENCOUNTER — Other Ambulatory Visit: Payer: Self-pay

## 2018-07-31 NOTE — Patient Outreach (Signed)
[facility]) Care Management  07/31/2018  Hollynn Garno 21-Apr-1934 [id number]  Unsuccessful outreach to confirm receipt of mailed resources. BSW left a HIPAA compliant voice message requesting a return call. BSW will attempt a second outreach in the next four business days.  Daneen Schick, BSW, CDP [facility] [phone]

## 2018-08-02 DIAGNOSIS — I480 Paroxysmal atrial fibrillation: Secondary | ICD-10-CM | POA: Diagnosis not present

## 2018-08-02 DIAGNOSIS — J449 Chronic obstructive pulmonary disease, unspecified: Secondary | ICD-10-CM | POA: Diagnosis not present

## 2018-08-02 DIAGNOSIS — Z905 Acquired absence of kidney: Secondary | ICD-10-CM | POA: Diagnosis not present

## 2018-08-02 DIAGNOSIS — K219 Gastro-esophageal reflux disease without esophagitis: Secondary | ICD-10-CM | POA: Diagnosis not present

## 2018-08-02 DIAGNOSIS — G9341 Metabolic encephalopathy: Secondary | ICD-10-CM | POA: Diagnosis not present

## 2018-08-02 DIAGNOSIS — I252 Old myocardial infarction: Secondary | ICD-10-CM | POA: Diagnosis not present

## 2018-08-02 DIAGNOSIS — E785 Hyperlipidemia, unspecified: Secondary | ICD-10-CM | POA: Diagnosis not present

## 2018-08-02 DIAGNOSIS — M1991 Primary osteoarthritis, unspecified site: Secondary | ICD-10-CM | POA: Diagnosis not present

## 2018-08-02 DIAGNOSIS — Z8673 Personal history of transient ischemic attack (TIA), and cerebral infarction without residual deficits: Secondary | ICD-10-CM | POA: Diagnosis not present

## 2018-08-02 DIAGNOSIS — Z85528 Personal history of other malignant neoplasm of kidney: Secondary | ICD-10-CM | POA: Diagnosis not present

## 2018-08-02 DIAGNOSIS — Z7951 Long term (current) use of inhaled steroids: Secondary | ICD-10-CM | POA: Diagnosis not present

## 2018-08-02 DIAGNOSIS — Z9181 History of falling: Secondary | ICD-10-CM | POA: Diagnosis not present

## 2018-08-02 DIAGNOSIS — Z951 Presence of aortocoronary bypass graft: Secondary | ICD-10-CM | POA: Diagnosis not present

## 2018-08-02 DIAGNOSIS — I2510 Atherosclerotic heart disease of native coronary artery without angina pectoris: Secondary | ICD-10-CM | POA: Diagnosis not present

## 2018-08-02 DIAGNOSIS — E039 Hypothyroidism, unspecified: Secondary | ICD-10-CM | POA: Diagnosis not present

## 2018-08-02 DIAGNOSIS — N3000 Acute cystitis without hematuria: Secondary | ICD-10-CM | POA: Diagnosis not present

## 2018-08-02 DIAGNOSIS — R739 Hyperglycemia, unspecified: Secondary | ICD-10-CM | POA: Diagnosis not present

## 2018-08-02 DIAGNOSIS — Z7902 Long term (current) use of antithrombotics/antiplatelets: Secondary | ICD-10-CM | POA: Diagnosis not present

## 2018-08-02 DIAGNOSIS — Z950 Presence of cardiac pacemaker: Secondary | ICD-10-CM | POA: Diagnosis not present

## 2018-08-02 DIAGNOSIS — Z85520 Personal history of malignant carcinoid tumor of kidney: Secondary | ICD-10-CM | POA: Diagnosis not present

## 2018-08-02 DIAGNOSIS — I5032 Chronic diastolic (congestive) heart failure: Secondary | ICD-10-CM | POA: Diagnosis not present

## 2018-08-02 DIAGNOSIS — F0390 Unspecified dementia without behavioral disturbance: Secondary | ICD-10-CM | POA: Diagnosis not present

## 2018-08-02 DIAGNOSIS — F419 Anxiety disorder, unspecified: Secondary | ICD-10-CM | POA: Diagnosis not present

## 2018-08-02 DIAGNOSIS — I110 Hypertensive heart disease with heart failure: Secondary | ICD-10-CM | POA: Diagnosis not present

## 2018-08-04 ENCOUNTER — Other Ambulatory Visit: Payer: Self-pay

## 2018-08-04 NOTE — Patient Outreach (Signed)
[facility]) Care Management  08/04/2018  Ronalda Walpole 1933/12/28 [id number]  Successful outreach to the patients home to confirm receipt of mailed resources. BSW spoke with the patients grand-daughter and primary caregiver Cranford Mon who is listed on patients consent. Ms. Marcelline Mates denies receiving mailing and requests BSW to re-send. BSW provided Ms. Routh with contact number to [facility] as well as [place] Location manager ([place] [facility]) during today's call. Ms. Marcelline Mates reports she will contact [place] [facility] to begin ramp referral process.   Plan: The patient will be outreached within the next three weeks to confirm receipt of mailed resources. Ms. Marcelline Mates request she be contacted on her cell at [phone].  Daneen Schick, BSW, CDP [facility] [phone]

## 2018-08-06 ENCOUNTER — Ambulatory Visit: Payer: Self-pay

## 2018-08-07 DIAGNOSIS — R82998 Other abnormal findings in urine: Secondary | ICD-10-CM | POA: Diagnosis not present

## 2018-08-07 DIAGNOSIS — Z905 Acquired absence of kidney: Secondary | ICD-10-CM | POA: Diagnosis not present

## 2018-08-07 DIAGNOSIS — Z8551 Personal history of malignant neoplasm of bladder: Secondary | ICD-10-CM | POA: Diagnosis not present

## 2018-08-08 DIAGNOSIS — I5032 Chronic diastolic (congestive) heart failure: Secondary | ICD-10-CM | POA: Diagnosis not present

## 2018-08-08 DIAGNOSIS — G9341 Metabolic encephalopathy: Secondary | ICD-10-CM | POA: Diagnosis not present

## 2018-08-08 DIAGNOSIS — I110 Hypertensive heart disease with heart failure: Secondary | ICD-10-CM | POA: Diagnosis not present

## 2018-08-08 DIAGNOSIS — N3000 Acute cystitis without hematuria: Secondary | ICD-10-CM | POA: Diagnosis not present

## 2018-08-18 ENCOUNTER — Ambulatory Visit: Payer: Self-pay

## 2018-08-18 ENCOUNTER — Other Ambulatory Visit: Payer: Self-pay

## 2018-08-18 DIAGNOSIS — N838 Other noninflammatory disorders of ovary, fallopian tube and broad ligament: Secondary | ICD-10-CM | POA: Diagnosis not present

## 2018-08-18 DIAGNOSIS — C679 Malignant neoplasm of bladder, unspecified: Secondary | ICD-10-CM | POA: Diagnosis not present

## 2018-08-18 NOTE — Patient Outreach (Addendum)
Cassandra Castro) Care Management  08/18/2018  Cassandra Castro 1934/03/28 [id number]   Unsuccessful outreach to patient's granddaughter, Albina Billet, to ensure receipt of home modification resources mailed by Nadara Eaton on 08/04/18.  BSW left voicemail message. Will attempt to reach again within four business days.  Addendum: BSW received return call from patient's granddaughter, Albina Billet. She did confirm receipt of home modification and transportation resources but said that she has not had the opportunity to review the information yet.  BSW is closing case at this time but encouraged granddaughter to call if she has any questions or if she needs assistance once able to review information provided.    Ronn Melena, BSW Social Worker [phone]

## 2018-08-20 ENCOUNTER — Other Ambulatory Visit: Payer: Self-pay | Admitting: Cardiology

## 2018-08-20 ENCOUNTER — Ambulatory Visit: Payer: Self-pay

## 2018-08-28 DIAGNOSIS — N838 Other noninflammatory disorders of ovary, fallopian tube and broad ligament: Secondary | ICD-10-CM | POA: Diagnosis not present

## 2018-08-28 DIAGNOSIS — Z91048 Other nonmedicinal substance allergy status: Secondary | ICD-10-CM | POA: Diagnosis not present

## 2018-08-28 DIAGNOSIS — Z882 Allergy status to sulfonamides status: Secondary | ICD-10-CM | POA: Diagnosis not present

## 2018-08-28 DIAGNOSIS — R29898 Other symptoms and signs involving the musculoskeletal system: Secondary | ICD-10-CM | POA: Diagnosis not present

## 2018-08-28 DIAGNOSIS — N840 Polyp of corpus uteri: Secondary | ICD-10-CM | POA: Diagnosis not present

## 2018-08-28 DIAGNOSIS — N950 Postmenopausal bleeding: Secondary | ICD-10-CM | POA: Diagnosis not present

## 2018-08-28 DIAGNOSIS — Z91040 Latex allergy status: Secondary | ICD-10-CM | POA: Diagnosis not present

## 2018-08-28 DIAGNOSIS — M1990 Unspecified osteoarthritis, unspecified site: Secondary | ICD-10-CM | POA: Diagnosis not present

## 2018-08-28 DIAGNOSIS — Z888 Allergy status to other drugs, medicaments and biological substances status: Secondary | ICD-10-CM | POA: Diagnosis not present

## 2018-08-28 DIAGNOSIS — Z85528 Personal history of other malignant neoplasm of kidney: Secondary | ICD-10-CM | POA: Diagnosis not present

## 2018-08-28 DIAGNOSIS — R1900 Intra-abdominal and pelvic swelling, mass and lump, unspecified site: Secondary | ICD-10-CM | POA: Diagnosis not present

## 2018-08-28 DIAGNOSIS — C679 Malignant neoplasm of bladder, unspecified: Secondary | ICD-10-CM | POA: Diagnosis not present

## 2018-08-28 DIAGNOSIS — Z885 Allergy status to narcotic agent status: Secondary | ICD-10-CM | POA: Diagnosis not present

## 2018-08-28 DIAGNOSIS — Z880 Allergy status to penicillin: Secondary | ICD-10-CM | POA: Diagnosis not present

## 2018-09-25 DIAGNOSIS — Z950 Presence of cardiac pacemaker: Secondary | ICD-10-CM | POA: Diagnosis not present

## 2018-09-25 DIAGNOSIS — I495 Sick sinus syndrome: Secondary | ICD-10-CM | POA: Diagnosis not present

## 2018-09-28 DIAGNOSIS — R29898 Other symptoms and signs involving the musculoskeletal system: Secondary | ICD-10-CM | POA: Diagnosis not present

## 2018-09-28 DIAGNOSIS — M1990 Unspecified osteoarthritis, unspecified site: Secondary | ICD-10-CM | POA: Diagnosis not present

## 2018-10-14 DIAGNOSIS — R399 Unspecified symptoms and signs involving the genitourinary system: Secondary | ICD-10-CM | POA: Diagnosis not present

## 2018-10-28 DIAGNOSIS — M1990 Unspecified osteoarthritis, unspecified site: Secondary | ICD-10-CM | POA: Diagnosis not present

## 2018-10-28 DIAGNOSIS — R29898 Other symptoms and signs involving the musculoskeletal system: Secondary | ICD-10-CM | POA: Diagnosis not present

## 2018-11-12 DIAGNOSIS — R4182 Altered mental status, unspecified: Secondary | ICD-10-CM | POA: Diagnosis not present

## 2018-11-12 DIAGNOSIS — R262 Difficulty in walking, not elsewhere classified: Secondary | ICD-10-CM | POA: Diagnosis not present

## 2018-11-12 DIAGNOSIS — G609 Hereditary and idiopathic neuropathy, unspecified: Secondary | ICD-10-CM | POA: Diagnosis not present

## 2018-11-12 DIAGNOSIS — R531 Weakness: Secondary | ICD-10-CM | POA: Diagnosis not present

## 2018-11-28 DIAGNOSIS — R29898 Other symptoms and signs involving the musculoskeletal system: Secondary | ICD-10-CM | POA: Diagnosis not present

## 2018-11-28 DIAGNOSIS — M1990 Unspecified osteoarthritis, unspecified site: Secondary | ICD-10-CM | POA: Diagnosis not present

## 2018-12-12 DIAGNOSIS — J019 Acute sinusitis, unspecified: Secondary | ICD-10-CM | POA: Diagnosis not present

## 2018-12-28 DIAGNOSIS — R29898 Other symptoms and signs involving the musculoskeletal system: Secondary | ICD-10-CM | POA: Diagnosis not present

## 2018-12-28 DIAGNOSIS — M1990 Unspecified osteoarthritis, unspecified site: Secondary | ICD-10-CM | POA: Diagnosis not present

## 2019-01-04 DIAGNOSIS — E039 Hypothyroidism, unspecified: Secondary | ICD-10-CM | POA: Diagnosis not present

## 2019-01-04 DIAGNOSIS — R0789 Other chest pain: Secondary | ICD-10-CM | POA: Diagnosis not present

## 2019-01-04 DIAGNOSIS — R079 Chest pain, unspecified: Secondary | ICD-10-CM | POA: Diagnosis not present

## 2019-01-04 DIAGNOSIS — K449 Diaphragmatic hernia without obstruction or gangrene: Secondary | ICD-10-CM | POA: Diagnosis not present

## 2019-01-04 DIAGNOSIS — E1165 Type 2 diabetes mellitus with hyperglycemia: Secondary | ICD-10-CM | POA: Diagnosis not present

## 2019-01-04 DIAGNOSIS — F419 Anxiety disorder, unspecified: Secondary | ICD-10-CM | POA: Diagnosis not present

## 2019-01-04 DIAGNOSIS — R072 Precordial pain: Secondary | ICD-10-CM | POA: Diagnosis not present

## 2019-01-04 DIAGNOSIS — N183 Chronic kidney disease, stage 3 (moderate): Secondary | ICD-10-CM | POA: Diagnosis not present

## 2019-01-04 DIAGNOSIS — R918 Other nonspecific abnormal finding of lung field: Secondary | ICD-10-CM | POA: Diagnosis not present

## 2019-01-04 DIAGNOSIS — R739 Hyperglycemia, unspecified: Secondary | ICD-10-CM | POA: Diagnosis not present

## 2019-01-04 DIAGNOSIS — Z79899 Other long term (current) drug therapy: Secondary | ICD-10-CM | POA: Diagnosis not present

## 2019-01-04 DIAGNOSIS — E669 Obesity, unspecified: Secondary | ICD-10-CM | POA: Diagnosis not present

## 2019-01-04 DIAGNOSIS — Z950 Presence of cardiac pacemaker: Secondary | ICD-10-CM | POA: Diagnosis not present

## 2019-01-04 DIAGNOSIS — R0902 Hypoxemia: Secondary | ICD-10-CM | POA: Diagnosis not present

## 2019-01-04 DIAGNOSIS — I2510 Atherosclerotic heart disease of native coronary artery without angina pectoris: Secondary | ICD-10-CM | POA: Diagnosis not present

## 2019-01-04 DIAGNOSIS — M7918 Myalgia, other site: Secondary | ICD-10-CM | POA: Diagnosis not present

## 2019-01-04 DIAGNOSIS — Z951 Presence of aortocoronary bypass graft: Secondary | ICD-10-CM | POA: Diagnosis not present

## 2019-01-04 DIAGNOSIS — R269 Unspecified abnormalities of gait and mobility: Secondary | ICD-10-CM | POA: Diagnosis not present

## 2019-01-04 DIAGNOSIS — J9811 Atelectasis: Secondary | ICD-10-CM | POA: Diagnosis not present

## 2019-01-04 DIAGNOSIS — Z7902 Long term (current) use of antithrombotics/antiplatelets: Secondary | ICD-10-CM | POA: Diagnosis not present

## 2019-01-04 DIAGNOSIS — I480 Paroxysmal atrial fibrillation: Secondary | ICD-10-CM | POA: Diagnosis not present

## 2019-01-04 DIAGNOSIS — R531 Weakness: Secondary | ICD-10-CM | POA: Diagnosis not present

## 2019-01-04 DIAGNOSIS — I129 Hypertensive chronic kidney disease with stage 1 through stage 4 chronic kidney disease, or unspecified chronic kidney disease: Secondary | ICD-10-CM | POA: Diagnosis not present

## 2019-01-04 DIAGNOSIS — Z7409 Other reduced mobility: Secondary | ICD-10-CM | POA: Diagnosis not present

## 2019-01-05 DIAGNOSIS — R079 Chest pain, unspecified: Secondary | ICD-10-CM | POA: Diagnosis not present

## 2019-01-05 DIAGNOSIS — I480 Paroxysmal atrial fibrillation: Secondary | ICD-10-CM | POA: Diagnosis not present

## 2019-01-05 DIAGNOSIS — E1122 Type 2 diabetes mellitus with diabetic chronic kidney disease: Secondary | ICD-10-CM | POA: Diagnosis not present

## 2019-01-05 DIAGNOSIS — N183 Chronic kidney disease, stage 3 (moderate): Secondary | ICD-10-CM | POA: Diagnosis not present

## 2019-01-05 DIAGNOSIS — R0902 Hypoxemia: Secondary | ICD-10-CM | POA: Diagnosis not present

## 2019-01-06 DIAGNOSIS — I482 Chronic atrial fibrillation, unspecified: Secondary | ICD-10-CM | POA: Diagnosis not present

## 2019-01-06 DIAGNOSIS — N183 Chronic kidney disease, stage 3 (moderate): Secondary | ICD-10-CM | POA: Diagnosis not present

## 2019-01-06 DIAGNOSIS — Z7901 Long term (current) use of anticoagulants: Secondary | ICD-10-CM | POA: Diagnosis not present

## 2019-01-06 DIAGNOSIS — R0902 Hypoxemia: Secondary | ICD-10-CM | POA: Diagnosis not present

## 2019-01-06 DIAGNOSIS — I480 Paroxysmal atrial fibrillation: Secondary | ICD-10-CM | POA: Diagnosis not present

## 2019-01-06 DIAGNOSIS — R0789 Other chest pain: Secondary | ICD-10-CM | POA: Diagnosis not present

## 2019-01-06 DIAGNOSIS — I2510 Atherosclerotic heart disease of native coronary artery without angina pectoris: Secondary | ICD-10-CM | POA: Diagnosis not present

## 2019-01-06 DIAGNOSIS — R079 Chest pain, unspecified: Secondary | ICD-10-CM | POA: Diagnosis not present

## 2019-01-06 DIAGNOSIS — Z951 Presence of aortocoronary bypass graft: Secondary | ICD-10-CM | POA: Diagnosis not present

## 2019-01-07 DIAGNOSIS — N183 Chronic kidney disease, stage 3 (moderate): Secondary | ICD-10-CM | POA: Diagnosis not present

## 2019-01-07 DIAGNOSIS — I4891 Unspecified atrial fibrillation: Secondary | ICD-10-CM | POA: Diagnosis not present

## 2019-01-07 DIAGNOSIS — Z950 Presence of cardiac pacemaker: Secondary | ICD-10-CM | POA: Diagnosis not present

## 2019-01-07 DIAGNOSIS — R079 Chest pain, unspecified: Secondary | ICD-10-CM | POA: Diagnosis not present

## 2019-01-07 DIAGNOSIS — R072 Precordial pain: Secondary | ICD-10-CM | POA: Diagnosis not present

## 2019-01-07 DIAGNOSIS — E039 Hypothyroidism, unspecified: Secondary | ICD-10-CM | POA: Diagnosis not present

## 2019-01-07 DIAGNOSIS — I480 Paroxysmal atrial fibrillation: Secondary | ICD-10-CM | POA: Diagnosis not present

## 2019-01-10 DIAGNOSIS — R262 Difficulty in walking, not elsewhere classified: Secondary | ICD-10-CM | POA: Diagnosis not present

## 2019-01-10 DIAGNOSIS — Z950 Presence of cardiac pacemaker: Secondary | ICD-10-CM | POA: Diagnosis not present

## 2019-01-10 DIAGNOSIS — I2510 Atherosclerotic heart disease of native coronary artery without angina pectoris: Secondary | ICD-10-CM | POA: Diagnosis not present

## 2019-01-10 DIAGNOSIS — Z7902 Long term (current) use of antithrombotics/antiplatelets: Secondary | ICD-10-CM | POA: Diagnosis not present

## 2019-01-10 DIAGNOSIS — I480 Paroxysmal atrial fibrillation: Secondary | ICD-10-CM | POA: Diagnosis not present

## 2019-01-10 DIAGNOSIS — E1165 Type 2 diabetes mellitus with hyperglycemia: Secondary | ICD-10-CM | POA: Diagnosis not present

## 2019-01-10 DIAGNOSIS — N183 Chronic kidney disease, stage 3 (moderate): Secondary | ICD-10-CM | POA: Diagnosis not present

## 2019-01-10 DIAGNOSIS — Z7951 Long term (current) use of inhaled steroids: Secondary | ICD-10-CM | POA: Diagnosis not present

## 2019-01-10 DIAGNOSIS — K219 Gastro-esophageal reflux disease without esophagitis: Secondary | ICD-10-CM | POA: Diagnosis not present

## 2019-01-10 DIAGNOSIS — M1991 Primary osteoarthritis, unspecified site: Secondary | ICD-10-CM | POA: Diagnosis not present

## 2019-01-10 DIAGNOSIS — Z85520 Personal history of malignant carcinoid tumor of kidney: Secondary | ICD-10-CM | POA: Diagnosis not present

## 2019-01-10 DIAGNOSIS — R531 Weakness: Secondary | ICD-10-CM | POA: Diagnosis not present

## 2019-01-10 DIAGNOSIS — E785 Hyperlipidemia, unspecified: Secondary | ICD-10-CM | POA: Diagnosis not present

## 2019-01-10 DIAGNOSIS — E1122 Type 2 diabetes mellitus with diabetic chronic kidney disease: Secondary | ICD-10-CM | POA: Diagnosis not present

## 2019-01-10 DIAGNOSIS — Z951 Presence of aortocoronary bypass graft: Secondary | ICD-10-CM | POA: Diagnosis not present

## 2019-01-10 DIAGNOSIS — I130 Hypertensive heart and chronic kidney disease with heart failure and stage 1 through stage 4 chronic kidney disease, or unspecified chronic kidney disease: Secondary | ICD-10-CM | POA: Diagnosis not present

## 2019-01-10 DIAGNOSIS — I252 Old myocardial infarction: Secondary | ICD-10-CM | POA: Diagnosis not present

## 2019-01-10 DIAGNOSIS — Z79899 Other long term (current) drug therapy: Secondary | ICD-10-CM | POA: Diagnosis not present

## 2019-01-10 DIAGNOSIS — J449 Chronic obstructive pulmonary disease, unspecified: Secondary | ICD-10-CM | POA: Diagnosis not present

## 2019-01-10 DIAGNOSIS — E669 Obesity, unspecified: Secondary | ICD-10-CM | POA: Diagnosis not present

## 2019-01-10 DIAGNOSIS — I5032 Chronic diastolic (congestive) heart failure: Secondary | ICD-10-CM | POA: Diagnosis not present

## 2019-01-10 DIAGNOSIS — E119 Type 2 diabetes mellitus without complications: Secondary | ICD-10-CM | POA: Diagnosis not present

## 2019-01-10 DIAGNOSIS — E039 Hypothyroidism, unspecified: Secondary | ICD-10-CM | POA: Diagnosis not present

## 2019-01-10 DIAGNOSIS — F0390 Unspecified dementia without behavioral disturbance: Secondary | ICD-10-CM | POA: Diagnosis not present

## 2019-01-10 DIAGNOSIS — F419 Anxiety disorder, unspecified: Secondary | ICD-10-CM | POA: Diagnosis not present

## 2019-01-16 DIAGNOSIS — I480 Paroxysmal atrial fibrillation: Secondary | ICD-10-CM | POA: Diagnosis not present

## 2019-01-16 DIAGNOSIS — K5900 Constipation, unspecified: Secondary | ICD-10-CM | POA: Diagnosis not present

## 2019-01-16 DIAGNOSIS — Z833 Family history of diabetes mellitus: Secondary | ICD-10-CM | POA: Diagnosis not present

## 2019-01-16 DIAGNOSIS — Z6833 Body mass index (BMI) 33.0-33.9, adult: Secondary | ICD-10-CM | POA: Diagnosis not present

## 2019-01-16 DIAGNOSIS — I129 Hypertensive chronic kidney disease with stage 1 through stage 4 chronic kidney disease, or unspecified chronic kidney disease: Secondary | ICD-10-CM | POA: Diagnosis not present

## 2019-01-16 DIAGNOSIS — R609 Edema, unspecified: Secondary | ICD-10-CM | POA: Diagnosis not present

## 2019-01-16 DIAGNOSIS — F419 Anxiety disorder, unspecified: Secondary | ICD-10-CM | POA: Diagnosis not present

## 2019-01-16 DIAGNOSIS — Z66 Do not resuscitate: Secondary | ICD-10-CM | POA: Diagnosis not present

## 2019-01-16 DIAGNOSIS — J80 Acute respiratory distress syndrome: Secondary | ICD-10-CM | POA: Diagnosis not present

## 2019-01-16 DIAGNOSIS — I25810 Atherosclerosis of coronary artery bypass graft(s) without angina pectoris: Secondary | ICD-10-CM | POA: Diagnosis not present

## 2019-01-16 DIAGNOSIS — G4733 Obstructive sleep apnea (adult) (pediatric): Secondary | ICD-10-CM | POA: Diagnosis not present

## 2019-01-16 DIAGNOSIS — F329 Major depressive disorder, single episode, unspecified: Secondary | ICD-10-CM | POA: Diagnosis not present

## 2019-01-16 DIAGNOSIS — Z951 Presence of aortocoronary bypass graft: Secondary | ICD-10-CM | POA: Diagnosis not present

## 2019-01-16 DIAGNOSIS — Z20828 Contact with and (suspected) exposure to other viral communicable diseases: Secondary | ICD-10-CM | POA: Diagnosis not present

## 2019-01-16 DIAGNOSIS — Z7902 Long term (current) use of antithrombotics/antiplatelets: Secondary | ICD-10-CM | POA: Diagnosis not present

## 2019-01-16 DIAGNOSIS — R069 Unspecified abnormalities of breathing: Secondary | ICD-10-CM | POA: Diagnosis not present

## 2019-01-16 DIAGNOSIS — Z950 Presence of cardiac pacemaker: Secondary | ICD-10-CM | POA: Diagnosis not present

## 2019-01-16 DIAGNOSIS — R0602 Shortness of breath: Secondary | ICD-10-CM | POA: Diagnosis not present

## 2019-01-16 DIAGNOSIS — I2510 Atherosclerotic heart disease of native coronary artery without angina pectoris: Secondary | ICD-10-CM | POA: Diagnosis not present

## 2019-01-16 DIAGNOSIS — I10 Essential (primary) hypertension: Secondary | ICD-10-CM | POA: Diagnosis not present

## 2019-01-16 DIAGNOSIS — R600 Localized edema: Secondary | ICD-10-CM | POA: Diagnosis not present

## 2019-01-16 DIAGNOSIS — E669 Obesity, unspecified: Secondary | ICD-10-CM | POA: Diagnosis not present

## 2019-01-16 DIAGNOSIS — N183 Chronic kidney disease, stage 3 (moderate): Secondary | ICD-10-CM | POA: Diagnosis not present

## 2019-01-16 DIAGNOSIS — E1122 Type 2 diabetes mellitus with diabetic chronic kidney disease: Secondary | ICD-10-CM | POA: Diagnosis not present

## 2019-01-16 DIAGNOSIS — E785 Hyperlipidemia, unspecified: Secondary | ICD-10-CM | POA: Diagnosis not present

## 2019-01-16 DIAGNOSIS — Z8673 Personal history of transient ischemic attack (TIA), and cerebral infarction without residual deficits: Secondary | ICD-10-CM | POA: Diagnosis not present

## 2019-01-16 DIAGNOSIS — E039 Hypothyroidism, unspecified: Secondary | ICD-10-CM | POA: Diagnosis not present

## 2019-01-16 DIAGNOSIS — J9601 Acute respiratory failure with hypoxia: Secondary | ICD-10-CM | POA: Diagnosis not present

## 2019-01-16 DIAGNOSIS — I495 Sick sinus syndrome: Secondary | ICD-10-CM | POA: Diagnosis not present

## 2019-01-19 DIAGNOSIS — I130 Hypertensive heart and chronic kidney disease with heart failure and stage 1 through stage 4 chronic kidney disease, or unspecified chronic kidney disease: Secondary | ICD-10-CM | POA: Diagnosis not present

## 2019-01-19 DIAGNOSIS — E119 Type 2 diabetes mellitus without complications: Secondary | ICD-10-CM | POA: Diagnosis not present

## 2019-01-19 DIAGNOSIS — N183 Chronic kidney disease, stage 3 (moderate): Secondary | ICD-10-CM | POA: Diagnosis not present

## 2019-01-19 DIAGNOSIS — E1165 Type 2 diabetes mellitus with hyperglycemia: Secondary | ICD-10-CM | POA: Diagnosis not present

## 2019-01-27 ENCOUNTER — Other Ambulatory Visit: Payer: Self-pay | Admitting: Cardiology

## 2019-01-27 DIAGNOSIS — R072 Precordial pain: Secondary | ICD-10-CM | POA: Diagnosis not present

## 2019-01-27 DIAGNOSIS — I130 Hypertensive heart and chronic kidney disease with heart failure and stage 1 through stage 4 chronic kidney disease, or unspecified chronic kidney disease: Secondary | ICD-10-CM | POA: Diagnosis not present

## 2019-01-27 DIAGNOSIS — Z79899 Other long term (current) drug therapy: Secondary | ICD-10-CM | POA: Diagnosis not present

## 2019-01-27 DIAGNOSIS — R51 Headache: Secondary | ICD-10-CM | POA: Diagnosis not present

## 2019-01-27 DIAGNOSIS — Z7984 Long term (current) use of oral hypoglycemic drugs: Secondary | ICD-10-CM | POA: Diagnosis not present

## 2019-01-27 DIAGNOSIS — R0989 Other specified symptoms and signs involving the circulatory and respiratory systems: Secondary | ICD-10-CM | POA: Diagnosis not present

## 2019-01-27 DIAGNOSIS — R2689 Other abnormalities of gait and mobility: Secondary | ICD-10-CM | POA: Diagnosis not present

## 2019-01-27 DIAGNOSIS — R109 Unspecified abdominal pain: Secondary | ICD-10-CM | POA: Diagnosis not present

## 2019-01-27 DIAGNOSIS — I4891 Unspecified atrial fibrillation: Secondary | ICD-10-CM | POA: Diagnosis not present

## 2019-01-27 DIAGNOSIS — E1165 Type 2 diabetes mellitus with hyperglycemia: Secondary | ICD-10-CM | POA: Diagnosis not present

## 2019-01-27 DIAGNOSIS — R0602 Shortness of breath: Secondary | ICD-10-CM | POA: Diagnosis not present

## 2019-01-27 DIAGNOSIS — R9431 Abnormal electrocardiogram [ECG] [EKG]: Secondary | ICD-10-CM | POA: Diagnosis not present

## 2019-01-27 DIAGNOSIS — I480 Paroxysmal atrial fibrillation: Secondary | ICD-10-CM | POA: Diagnosis not present

## 2019-01-27 DIAGNOSIS — R05 Cough: Secondary | ICD-10-CM | POA: Diagnosis not present

## 2019-01-27 DIAGNOSIS — E039 Hypothyroidism, unspecified: Secondary | ICD-10-CM | POA: Diagnosis not present

## 2019-01-27 DIAGNOSIS — R079 Chest pain, unspecified: Secondary | ICD-10-CM | POA: Diagnosis not present

## 2019-01-27 DIAGNOSIS — E1122 Type 2 diabetes mellitus with diabetic chronic kidney disease: Secondary | ICD-10-CM | POA: Diagnosis not present

## 2019-01-27 DIAGNOSIS — I10 Essential (primary) hypertension: Secondary | ICD-10-CM | POA: Diagnosis not present

## 2019-01-27 DIAGNOSIS — Z9114 Patient's other noncompliance with medication regimen: Secondary | ICD-10-CM | POA: Diagnosis not present

## 2019-01-27 DIAGNOSIS — E119 Type 2 diabetes mellitus without complications: Secondary | ICD-10-CM | POA: Diagnosis not present

## 2019-01-27 DIAGNOSIS — M7989 Other specified soft tissue disorders: Secondary | ICD-10-CM | POA: Diagnosis not present

## 2019-01-27 DIAGNOSIS — Z20828 Contact with and (suspected) exposure to other viral communicable diseases: Secondary | ICD-10-CM | POA: Diagnosis not present

## 2019-01-27 DIAGNOSIS — R0600 Dyspnea, unspecified: Secondary | ICD-10-CM | POA: Diagnosis not present

## 2019-01-27 DIAGNOSIS — Z7902 Long term (current) use of antithrombotics/antiplatelets: Secondary | ICD-10-CM | POA: Diagnosis not present

## 2019-01-27 DIAGNOSIS — Z7409 Other reduced mobility: Secondary | ICD-10-CM | POA: Diagnosis not present

## 2019-01-27 DIAGNOSIS — N183 Chronic kidney disease, stage 3 (moderate): Secondary | ICD-10-CM | POA: Diagnosis not present

## 2019-01-27 DIAGNOSIS — R52 Pain, unspecified: Secondary | ICD-10-CM | POA: Diagnosis not present

## 2019-01-27 DIAGNOSIS — I160 Hypertensive urgency: Secondary | ICD-10-CM | POA: Diagnosis not present

## 2019-01-27 DIAGNOSIS — I5032 Chronic diastolic (congestive) heart failure: Secondary | ICD-10-CM | POA: Diagnosis not present

## 2019-01-27 DIAGNOSIS — R7989 Other specified abnormal findings of blood chemistry: Secondary | ICD-10-CM | POA: Diagnosis not present

## 2019-01-27 DIAGNOSIS — Z951 Presence of aortocoronary bypass graft: Secondary | ICD-10-CM | POA: Diagnosis not present

## 2019-01-28 DIAGNOSIS — E1165 Type 2 diabetes mellitus with hyperglycemia: Secondary | ICD-10-CM | POA: Diagnosis not present

## 2019-01-28 DIAGNOSIS — E038 Other specified hypothyroidism: Secondary | ICD-10-CM | POA: Diagnosis not present

## 2019-01-28 DIAGNOSIS — R29898 Other symptoms and signs involving the musculoskeletal system: Secondary | ICD-10-CM | POA: Diagnosis not present

## 2019-01-28 DIAGNOSIS — M1990 Unspecified osteoarthritis, unspecified site: Secondary | ICD-10-CM | POA: Diagnosis not present

## 2019-01-28 DIAGNOSIS — I480 Paroxysmal atrial fibrillation: Secondary | ICD-10-CM | POA: Diagnosis not present

## 2019-01-28 DIAGNOSIS — I10 Essential (primary) hypertension: Secondary | ICD-10-CM | POA: Diagnosis not present

## 2019-02-02 DIAGNOSIS — N3001 Acute cystitis with hematuria: Secondary | ICD-10-CM | POA: Diagnosis not present

## 2019-02-02 DIAGNOSIS — R109 Unspecified abdominal pain: Secondary | ICD-10-CM | POA: Diagnosis not present

## 2019-02-02 DIAGNOSIS — E1165 Type 2 diabetes mellitus with hyperglycemia: Secondary | ICD-10-CM | POA: Diagnosis not present

## 2019-02-02 DIAGNOSIS — I10 Essential (primary) hypertension: Secondary | ICD-10-CM | POA: Diagnosis not present

## 2019-02-02 DIAGNOSIS — E039 Hypothyroidism, unspecified: Secondary | ICD-10-CM | POA: Diagnosis not present

## 2019-02-09 DIAGNOSIS — I480 Paroxysmal atrial fibrillation: Secondary | ICD-10-CM | POA: Diagnosis not present

## 2019-02-09 DIAGNOSIS — J449 Chronic obstructive pulmonary disease, unspecified: Secondary | ICD-10-CM | POA: Diagnosis not present

## 2019-02-09 DIAGNOSIS — R262 Difficulty in walking, not elsewhere classified: Secondary | ICD-10-CM | POA: Diagnosis not present

## 2019-02-09 DIAGNOSIS — Z7902 Long term (current) use of antithrombotics/antiplatelets: Secondary | ICD-10-CM | POA: Diagnosis not present

## 2019-02-09 DIAGNOSIS — Z79899 Other long term (current) drug therapy: Secondary | ICD-10-CM | POA: Diagnosis not present

## 2019-02-09 DIAGNOSIS — N183 Chronic kidney disease, stage 3 (moderate): Secondary | ICD-10-CM | POA: Diagnosis not present

## 2019-02-09 DIAGNOSIS — I2510 Atherosclerotic heart disease of native coronary artery without angina pectoris: Secondary | ICD-10-CM | POA: Diagnosis not present

## 2019-02-09 DIAGNOSIS — M1991 Primary osteoarthritis, unspecified site: Secondary | ICD-10-CM | POA: Diagnosis not present

## 2019-02-09 DIAGNOSIS — F0390 Unspecified dementia without behavioral disturbance: Secondary | ICD-10-CM | POA: Diagnosis not present

## 2019-02-09 DIAGNOSIS — E119 Type 2 diabetes mellitus without complications: Secondary | ICD-10-CM | POA: Diagnosis not present

## 2019-02-09 DIAGNOSIS — E039 Hypothyroidism, unspecified: Secondary | ICD-10-CM | POA: Diagnosis not present

## 2019-02-09 DIAGNOSIS — Z951 Presence of aortocoronary bypass graft: Secondary | ICD-10-CM | POA: Diagnosis not present

## 2019-02-09 DIAGNOSIS — Z950 Presence of cardiac pacemaker: Secondary | ICD-10-CM | POA: Diagnosis not present

## 2019-02-09 DIAGNOSIS — R531 Weakness: Secondary | ICD-10-CM | POA: Diagnosis not present

## 2019-02-09 DIAGNOSIS — E785 Hyperlipidemia, unspecified: Secondary | ICD-10-CM | POA: Diagnosis not present

## 2019-02-09 DIAGNOSIS — I5032 Chronic diastolic (congestive) heart failure: Secondary | ICD-10-CM | POA: Diagnosis not present

## 2019-02-09 DIAGNOSIS — I130 Hypertensive heart and chronic kidney disease with heart failure and stage 1 through stage 4 chronic kidney disease, or unspecified chronic kidney disease: Secondary | ICD-10-CM | POA: Diagnosis not present

## 2019-02-09 DIAGNOSIS — Z7951 Long term (current) use of inhaled steroids: Secondary | ICD-10-CM | POA: Diagnosis not present

## 2019-02-09 DIAGNOSIS — E1122 Type 2 diabetes mellitus with diabetic chronic kidney disease: Secondary | ICD-10-CM | POA: Diagnosis not present

## 2019-02-09 DIAGNOSIS — Z85520 Personal history of malignant carcinoid tumor of kidney: Secondary | ICD-10-CM | POA: Diagnosis not present

## 2019-02-09 DIAGNOSIS — F419 Anxiety disorder, unspecified: Secondary | ICD-10-CM | POA: Diagnosis not present

## 2019-02-09 DIAGNOSIS — K219 Gastro-esophageal reflux disease without esophagitis: Secondary | ICD-10-CM | POA: Diagnosis not present

## 2019-02-09 DIAGNOSIS — E669 Obesity, unspecified: Secondary | ICD-10-CM | POA: Diagnosis not present

## 2019-02-09 DIAGNOSIS — I252 Old myocardial infarction: Secondary | ICD-10-CM | POA: Diagnosis not present

## 2019-02-09 DIAGNOSIS — E1165 Type 2 diabetes mellitus with hyperglycemia: Secondary | ICD-10-CM | POA: Diagnosis not present

## 2019-02-13 DIAGNOSIS — R160 Hepatomegaly, not elsewhere classified: Secondary | ICD-10-CM | POA: Diagnosis not present

## 2019-02-24 DIAGNOSIS — R55 Syncope and collapse: Secondary | ICD-10-CM

## 2019-02-24 HISTORY — DX: Syncope and collapse: R55

## 2019-02-26 DIAGNOSIS — C679 Malignant neoplasm of bladder, unspecified: Secondary | ICD-10-CM | POA: Diagnosis not present

## 2019-02-26 DIAGNOSIS — K7689 Other specified diseases of liver: Secondary | ICD-10-CM | POA: Diagnosis not present

## 2019-02-26 DIAGNOSIS — R1900 Intra-abdominal and pelvic swelling, mass and lump, unspecified site: Secondary | ICD-10-CM | POA: Diagnosis not present

## 2019-02-26 DIAGNOSIS — R1907 Generalized intra-abdominal and pelvic swelling, mass and lump: Secondary | ICD-10-CM | POA: Diagnosis not present

## 2019-02-26 DIAGNOSIS — N838 Other noninflammatory disorders of ovary, fallopian tube and broad ligament: Secondary | ICD-10-CM | POA: Diagnosis not present

## 2019-02-26 DIAGNOSIS — Z905 Acquired absence of kidney: Secondary | ICD-10-CM | POA: Diagnosis not present

## 2019-02-28 DIAGNOSIS — M1990 Unspecified osteoarthritis, unspecified site: Secondary | ICD-10-CM | POA: Diagnosis not present

## 2019-02-28 DIAGNOSIS — R29898 Other symptoms and signs involving the musculoskeletal system: Secondary | ICD-10-CM | POA: Diagnosis not present

## 2019-03-07 DIAGNOSIS — R1084 Generalized abdominal pain: Secondary | ICD-10-CM | POA: Diagnosis not present

## 2019-03-07 DIAGNOSIS — R52 Pain, unspecified: Secondary | ICD-10-CM | POA: Diagnosis not present

## 2019-03-07 DIAGNOSIS — R55 Syncope and collapse: Secondary | ICD-10-CM | POA: Diagnosis not present

## 2019-03-07 DIAGNOSIS — M5489 Other dorsalgia: Secondary | ICD-10-CM | POA: Diagnosis not present

## 2019-03-07 DIAGNOSIS — R404 Transient alteration of awareness: Secondary | ICD-10-CM | POA: Diagnosis not present

## 2019-03-08 ENCOUNTER — Other Ambulatory Visit: Payer: Self-pay

## 2019-03-08 ENCOUNTER — Encounter (HOSPITAL_COMMUNITY): Payer: Self-pay | Admitting: Emergency Medicine

## 2019-03-08 ENCOUNTER — Inpatient Hospital Stay (HOSPITAL_COMMUNITY)
Admission: EM | Admit: 2019-03-08 | Discharge: 2019-03-11 | DRG: 439 | Disposition: A | Discharge status: Hospice | Payer: PPO | Attending: Internal Medicine | Admitting: Internal Medicine

## 2019-03-08 ENCOUNTER — Emergency Department (HOSPITAL_COMMUNITY): Payer: PPO

## 2019-03-08 DIAGNOSIS — K858 Other acute pancreatitis without necrosis or infection: Principal | ICD-10-CM | POA: Diagnosis present

## 2019-03-08 DIAGNOSIS — N183 Chronic kidney disease, stage 3 unspecified: Secondary | ICD-10-CM | POA: Diagnosis present

## 2019-03-08 DIAGNOSIS — Z66 Do not resuscitate: Secondary | ICD-10-CM | POA: Diagnosis present

## 2019-03-08 DIAGNOSIS — K2980 Duodenitis without bleeding: Secondary | ICD-10-CM | POA: Diagnosis present

## 2019-03-08 DIAGNOSIS — E669 Obesity, unspecified: Secondary | ICD-10-CM | POA: Diagnosis present

## 2019-03-08 DIAGNOSIS — I252 Old myocardial infarction: Secondary | ICD-10-CM | POA: Diagnosis not present

## 2019-03-08 DIAGNOSIS — Z905 Acquired absence of kidney: Secondary | ICD-10-CM

## 2019-03-08 DIAGNOSIS — I2510 Atherosclerotic heart disease of native coronary artery without angina pectoris: Secondary | ICD-10-CM | POA: Diagnosis not present

## 2019-03-08 DIAGNOSIS — I509 Heart failure, unspecified: Secondary | ICD-10-CM | POA: Diagnosis present

## 2019-03-08 DIAGNOSIS — Z85528 Personal history of other malignant neoplasm of kidney: Secondary | ICD-10-CM

## 2019-03-08 DIAGNOSIS — M2550 Pain in unspecified joint: Secondary | ICD-10-CM | POA: Diagnosis not present

## 2019-03-08 DIAGNOSIS — R680 Hypothermia, not associated with low environmental temperature: Secondary | ICD-10-CM | POA: Diagnosis present

## 2019-03-08 DIAGNOSIS — K279 Peptic ulcer, site unspecified, unspecified as acute or chronic, without hemorrhage or perforation: Secondary | ICD-10-CM | POA: Diagnosis present

## 2019-03-08 DIAGNOSIS — E860 Dehydration: Secondary | ICD-10-CM | POA: Diagnosis not present

## 2019-03-08 DIAGNOSIS — Z20828 Contact with and (suspected) exposure to other viral communicable diseases: Secondary | ICD-10-CM | POA: Diagnosis present

## 2019-03-08 DIAGNOSIS — E785 Hyperlipidemia, unspecified: Secondary | ICD-10-CM | POA: Diagnosis not present

## 2019-03-08 DIAGNOSIS — C561 Malignant neoplasm of right ovary: Secondary | ICD-10-CM | POA: Diagnosis present

## 2019-03-08 DIAGNOSIS — R918 Other nonspecific abnormal finding of lung field: Secondary | ICD-10-CM | POA: Diagnosis not present

## 2019-03-08 DIAGNOSIS — R1084 Generalized abdominal pain: Secondary | ICD-10-CM | POA: Diagnosis not present

## 2019-03-08 DIAGNOSIS — Z91048 Other nonmedicinal substance allergy status: Secondary | ICD-10-CM

## 2019-03-08 DIAGNOSIS — F419 Anxiety disorder, unspecified: Secondary | ICD-10-CM | POA: Diagnosis present

## 2019-03-08 DIAGNOSIS — Z820 Family history of epilepsy and other diseases of the nervous system: Secondary | ICD-10-CM

## 2019-03-08 DIAGNOSIS — I495 Sick sinus syndrome: Secondary | ICD-10-CM | POA: Diagnosis not present

## 2019-03-08 DIAGNOSIS — Z880 Allergy status to penicillin: Secondary | ICD-10-CM

## 2019-03-08 DIAGNOSIS — Z951 Presence of aortocoronary bypass graft: Secondary | ICD-10-CM

## 2019-03-08 DIAGNOSIS — E039 Hypothyroidism, unspecified: Secondary | ICD-10-CM | POA: Diagnosis present

## 2019-03-08 DIAGNOSIS — I130 Hypertensive heart and chronic kidney disease with heart failure and stage 1 through stage 4 chronic kidney disease, or unspecified chronic kidney disease: Secondary | ICD-10-CM | POA: Diagnosis present

## 2019-03-08 DIAGNOSIS — K439 Ventral hernia without obstruction or gangrene: Secondary | ICD-10-CM | POA: Diagnosis present

## 2019-03-08 DIAGNOSIS — Z515 Encounter for palliative care: Secondary | ICD-10-CM | POA: Diagnosis not present

## 2019-03-08 DIAGNOSIS — N9489 Other specified conditions associated with female genital organs and menstrual cycle: Secondary | ICD-10-CM | POA: Diagnosis not present

## 2019-03-08 DIAGNOSIS — I25119 Atherosclerotic heart disease of native coronary artery with unspecified angina pectoris: Secondary | ICD-10-CM | POA: Diagnosis present

## 2019-03-08 DIAGNOSIS — E1122 Type 2 diabetes mellitus with diabetic chronic kidney disease: Secondary | ICD-10-CM | POA: Diagnosis not present

## 2019-03-08 DIAGNOSIS — C787 Secondary malignant neoplasm of liver and intrahepatic bile duct: Secondary | ICD-10-CM | POA: Diagnosis present

## 2019-03-08 DIAGNOSIS — R55 Syncope and collapse: Secondary | ICD-10-CM | POA: Diagnosis not present

## 2019-03-08 DIAGNOSIS — Z833 Family history of diabetes mellitus: Secondary | ICD-10-CM

## 2019-03-08 DIAGNOSIS — Z882 Allergy status to sulfonamides status: Secondary | ICD-10-CM

## 2019-03-08 DIAGNOSIS — Z7189 Other specified counseling: Secondary | ICD-10-CM | POA: Diagnosis not present

## 2019-03-08 DIAGNOSIS — Z7902 Long term (current) use of antithrombotics/antiplatelets: Secondary | ICD-10-CM

## 2019-03-08 DIAGNOSIS — K859 Acute pancreatitis without necrosis or infection, unspecified: Secondary | ICD-10-CM | POA: Diagnosis present

## 2019-03-08 DIAGNOSIS — I480 Paroxysmal atrial fibrillation: Secondary | ICD-10-CM | POA: Diagnosis present

## 2019-03-08 DIAGNOSIS — Z950 Presence of cardiac pacemaker: Secondary | ICD-10-CM

## 2019-03-08 DIAGNOSIS — Z906 Acquired absence of other parts of urinary tract: Secondary | ICD-10-CM | POA: Diagnosis not present

## 2019-03-08 DIAGNOSIS — N179 Acute kidney failure, unspecified: Secondary | ICD-10-CM | POA: Diagnosis not present

## 2019-03-08 DIAGNOSIS — Z885 Allergy status to narcotic agent status: Secondary | ICD-10-CM

## 2019-03-08 DIAGNOSIS — R52 Pain, unspecified: Secondary | ICD-10-CM | POA: Diagnosis not present

## 2019-03-08 DIAGNOSIS — E871 Hypo-osmolality and hyponatremia: Secondary | ICD-10-CM | POA: Diagnosis present

## 2019-03-08 DIAGNOSIS — Z79899 Other long term (current) drug therapy: Secondary | ICD-10-CM

## 2019-03-08 DIAGNOSIS — K6389 Other specified diseases of intestine: Secondary | ICD-10-CM | POA: Diagnosis not present

## 2019-03-08 DIAGNOSIS — K5900 Constipation, unspecified: Secondary | ICD-10-CM | POA: Diagnosis not present

## 2019-03-08 DIAGNOSIS — I499 Cardiac arrhythmia, unspecified: Secondary | ICD-10-CM | POA: Diagnosis not present

## 2019-03-08 DIAGNOSIS — Z91040 Latex allergy status: Secondary | ICD-10-CM

## 2019-03-08 DIAGNOSIS — R1909 Other intra-abdominal and pelvic swelling, mass and lump: Secondary | ICD-10-CM | POA: Diagnosis not present

## 2019-03-08 DIAGNOSIS — Z7401 Bed confinement status: Secondary | ICD-10-CM | POA: Diagnosis not present

## 2019-03-08 DIAGNOSIS — M810 Age-related osteoporosis without current pathological fracture: Secondary | ICD-10-CM | POA: Diagnosis present

## 2019-03-08 DIAGNOSIS — I119 Hypertensive heart disease without heart failure: Secondary | ICD-10-CM

## 2019-03-08 DIAGNOSIS — Z8551 Personal history of malignant neoplasm of bladder: Secondary | ICD-10-CM

## 2019-03-08 DIAGNOSIS — Z888 Allergy status to other drugs, medicaments and biological substances status: Secondary | ICD-10-CM

## 2019-03-08 DIAGNOSIS — Z8249 Family history of ischemic heart disease and other diseases of the circulatory system: Secondary | ICD-10-CM

## 2019-03-08 DIAGNOSIS — Z79890 Hormone replacement therapy (postmenopausal): Secondary | ICD-10-CM

## 2019-03-08 DIAGNOSIS — C679 Malignant neoplasm of bladder, unspecified: Secondary | ICD-10-CM | POA: Diagnosis present

## 2019-03-08 DIAGNOSIS — Z6832 Body mass index (BMI) 32.0-32.9, adult: Secondary | ICD-10-CM

## 2019-03-08 DIAGNOSIS — Z8673 Personal history of transient ischemic attack (TIA), and cerebral infarction without residual deficits: Secondary | ICD-10-CM

## 2019-03-08 LAB — COMPREHENSIVE METABOLIC PANEL
ALT: 15 U/L (ref 0–44)
ALT: 13 U/L (ref 0–44)
AST: 24 U/L (ref 15–41)
AST: 23 U/L (ref 15–41)
Albumin: 3.5 g/dL (ref 3.5–5.0)
Albumin: 3.3 g/dL — ABNORMAL LOW (ref 3.5–5.0)
Alkaline Phosphatase: 75 U/L (ref 38–126)
Alkaline Phosphatase: 69 U/L (ref 38–126)
Anion gap: 13 (ref 5–15)
Anion gap: 13 (ref 5–15)
BUN: 23 mg/dL (ref 8–23)
BUN: 23 mg/dL (ref 8–23)
CO2: 22 mmol/L (ref 22–32)
CO2: 21 mmol/L — ABNORMAL LOW (ref 22–32)
Calcium: 8.5 mg/dL — ABNORMAL LOW (ref 8.9–10.3)
Calcium: 7.9 mg/dL — ABNORMAL LOW (ref 8.9–10.3)
Chloride: 97 mmol/L — ABNORMAL LOW (ref 98–111)
Chloride: 98 mmol/L (ref 98–111)
Creatinine, Ser: 1.6 mg/dL — ABNORMAL HIGH (ref 0.44–1.00)
Creatinine, Ser: 1.46 mg/dL — ABNORMAL HIGH (ref 0.44–1.00)
GFR calc Af Amer: 34 mL/min — ABNORMAL LOW (ref >60)
GFR calc Af Amer: 38 mL/min — ABNORMAL LOW (ref >60)
GFR calc non Af Amer: 29 mL/min — ABNORMAL LOW (ref >60)
GFR calc non Af Amer: 33 mL/min — ABNORMAL LOW (ref >60)
Glucose, Bld: 168 mg/dL — ABNORMAL HIGH (ref 70–99)
Glucose, Bld: 167 mg/dL — ABNORMAL HIGH (ref 70–99)
Potassium: 3.5 mmol/L (ref 3.5–5.1)
Potassium: 4 mmol/L (ref 3.5–5.1)
Sodium: 132 mmol/L — ABNORMAL LOW (ref 135–145)
Sodium: 132 mmol/L — ABNORMAL LOW (ref 135–145)
Total Bilirubin: 0.6 mg/dL (ref 0.3–1.2)
Total Bilirubin: 0.4 mg/dL (ref 0.3–1.2)
Total Protein: 6.4 g/dL — ABNORMAL LOW (ref 6.5–8.1)
Total Protein: 6.2 g/dL — ABNORMAL LOW (ref 6.5–8.1)

## 2019-03-08 LAB — CBC
HCT: 37 % (ref 36.0–46.0)
HCT: 37.9 % (ref 36.0–46.0)
Hemoglobin: 11.5 g/dL — ABNORMAL LOW (ref 12.0–15.0)
Hemoglobin: 12.3 g/dL (ref 12.0–15.0)
MCH: 28.3 pg (ref 26.0–34.0)
MCH: 28.7 pg (ref 26.0–34.0)
MCHC: 31.1 g/dL (ref 30.0–36.0)
MCHC: 32.5 g/dL (ref 30.0–36.0)
MCV: 91.1 fL (ref 80.0–100.0)
MCV: 88.3 fL (ref 80.0–100.0)
Platelets: 381 10*3/uL (ref 150–400)
Platelets: 378 10*3/uL (ref 150–400)
RBC: 4.06 MIL/uL (ref 3.87–5.11)
RBC: 4.29 MIL/uL (ref 3.87–5.11)
RDW: 15.9 % — ABNORMAL HIGH (ref 11.5–15.5)
RDW: 15.9 % — ABNORMAL HIGH (ref 11.5–15.5)
WBC: 8.6 10*3/uL (ref 4.0–10.5)
WBC: 11.5 10*3/uL — ABNORMAL HIGH (ref 4.0–10.5)
nRBC: 0 % (ref 0.0–0.2)
nRBC: 0 % (ref 0.0–0.2)

## 2019-03-08 LAB — CBC WITH DIFFERENTIAL/PLATELET
Abs Immature Granulocytes: 0.12 10*3/uL — ABNORMAL HIGH (ref 0.00–0.07)
Basophils Absolute: 0 10*3/uL (ref 0.0–0.1)
Basophils Relative: 0 %
Eosinophils Absolute: 0.1 10*3/uL (ref 0.0–0.5)
Eosinophils Relative: 1 %
HCT: 35.3 % — ABNORMAL LOW (ref 36.0–46.0)
Hemoglobin: 11.3 g/dL — ABNORMAL LOW (ref 12.0–15.0)
Immature Granulocytes: 1 %
Lymphocytes Relative: 26 %
Lymphs Abs: 2.3 10*3/uL (ref 0.7–4.0)
MCH: 28.6 pg (ref 26.0–34.0)
MCHC: 32 g/dL (ref 30.0–36.0)
MCV: 89.4 fL (ref 80.0–100.0)
Monocytes Absolute: 0.6 10*3/uL (ref 0.1–1.0)
Monocytes Relative: 6 %
Neutro Abs: 5.9 10*3/uL (ref 1.7–7.7)
Neutrophils Relative %: 66 %
Platelets: 354 10*3/uL (ref 150–400)
RBC: 3.95 MIL/uL (ref 3.87–5.11)
RDW: 15.8 % — ABNORMAL HIGH (ref 11.5–15.5)
WBC: 9 10*3/uL (ref 4.0–10.5)
nRBC: 0 % (ref 0.0–0.2)

## 2019-03-08 LAB — URINALYSIS, ROUTINE W REFLEX MICROSCOPIC
Bilirubin Urine: NEGATIVE
Glucose, UA: NEGATIVE mg/dL
Hgb urine dipstick: NEGATIVE
Ketones, ur: NEGATIVE mg/dL
Leukocytes,Ua: NEGATIVE
Nitrite: NEGATIVE
Protein, ur: NEGATIVE mg/dL
Specific Gravity, Urine: 1.015 (ref 1.005–1.030)
pH: 5 (ref 5.0–8.0)

## 2019-03-08 LAB — TYPE AND SCREEN
ABO/RH(D): O POS
Antibody Screen: NEGATIVE

## 2019-03-08 LAB — GLUCOSE, CAPILLARY
Glucose-Capillary: 156 mg/dL — ABNORMAL HIGH (ref 70–99)
Glucose-Capillary: 190 mg/dL — ABNORMAL HIGH (ref 70–99)
Glucose-Capillary: 198 mg/dL — ABNORMAL HIGH (ref 70–99)
Glucose-Capillary: 176 mg/dL — ABNORMAL HIGH (ref 70–99)

## 2019-03-08 LAB — LIPASE, BLOOD
Lipase: 2181 U/L — ABNORMAL HIGH (ref 11–51)
Lipase: 1262 U/L — ABNORMAL HIGH (ref 11–51)

## 2019-03-08 LAB — TROPONIN I (HIGH SENSITIVITY)
Troponin I (High Sensitivity): 8 ng/L (ref <18)
Troponin I (High Sensitivity): 7 ng/L (ref <18)

## 2019-03-08 LAB — POC OCCULT BLOOD, ED: Fecal Occult Bld: NEGATIVE

## 2019-03-08 LAB — LACTIC ACID, PLASMA
Lactic Acid, Venous: 2.5 mmol/L (ref 0.5–1.9)
Lactic Acid, Venous: 2.5 mmol/L (ref 0.5–1.9)

## 2019-03-08 LAB — ABO/RH: ABO/RH(D): O POS

## 2019-03-08 LAB — SARS CORONAVIRUS 2 BY RT PCR (HOSPITAL ORDER, PERFORMED IN ~~LOC~~ HOSPITAL LAB): SARS Coronavirus 2: NEGATIVE

## 2019-03-08 MED ORDER — NALOXONE HCL 0.4 MG/ML IJ SOLN
0.4000 mg | Freq: Once | INTRAMUSCULAR | Status: AC
  Administered 2019-03-08: 11:00:00 0.4 mg via INTRAVENOUS

## 2019-03-08 MED ORDER — INSULIN ASPART 100 UNIT/ML ~~LOC~~ SOLN
0.0000 [IU] | Freq: Three times a day (TID) | SUBCUTANEOUS | Status: DC
  Administered 2019-03-08 – 2019-03-11 (×10): 2 [IU] via SUBCUTANEOUS

## 2019-03-08 MED ORDER — ONDANSETRON HCL 4 MG/2ML IJ SOLN
4.0000 mg | Freq: Four times a day (QID) | INTRAMUSCULAR | Status: DC | PRN
  Administered 2019-03-10 – 2019-03-11 (×2): 4 mg via INTRAVENOUS
  Filled 2019-03-08 (×2): qty 2

## 2019-03-08 MED ORDER — FENTANYL CITRATE (PF) 100 MCG/2ML IJ SOLN
50.0000 ug | Freq: Once | INTRAMUSCULAR | Status: AC
  Administered 2019-03-08: 50 ug via INTRAVENOUS
  Filled 2019-03-08: qty 2

## 2019-03-08 MED ORDER — HEPARIN SODIUM (PORCINE) 5000 UNIT/ML IJ SOLN
5000.0000 [IU] | Freq: Three times a day (TID) | INTRAMUSCULAR | Status: DC
  Administered 2019-03-08 – 2019-03-11 (×8): 5000 [IU] via SUBCUTANEOUS
  Filled 2019-03-08 (×9): qty 1

## 2019-03-08 MED ORDER — HYDROMORPHONE HCL 1 MG/ML IJ SOLN
1.0000 mg | INTRAMUSCULAR | Status: DC | PRN
  Administered 2019-03-08: 08:00:00 1 mg via INTRAVENOUS
  Filled 2019-03-08 (×2): qty 1

## 2019-03-08 MED ORDER — AMIODARONE HCL 100 MG PO TABS
50.0000 mg | ORAL_TABLET | Freq: Every day | ORAL | Status: DC
  Administered 2019-03-08 – 2019-03-11 (×4): 50 mg via ORAL
  Filled 2019-03-08 (×4): qty 1

## 2019-03-08 MED ORDER — HYDROMORPHONE HCL 1 MG/ML IJ SOLN: 0.5000 mg | INTRAMUSCULAR | Status: DC | PRN

## 2019-03-08 MED ORDER — METRONIDAZOLE IN NACL 5-0.79 MG/ML-% IV SOLN
500.0000 mg | Freq: Three times a day (TID) | INTRAVENOUS | Status: DC
  Administered 2019-03-08 – 2019-03-10 (×7): 500 mg via INTRAVENOUS
  Filled 2019-03-08 (×7): qty 100

## 2019-03-08 MED ORDER — ALPRAZOLAM 0.5 MG PO TABS
0.5000 mg | ORAL_TABLET | Freq: Three times a day (TID) | ORAL | Status: DC | PRN
  Administered 2019-03-09 – 2019-03-11 (×4): 0.5 mg via ORAL
  Filled 2019-03-08 (×4): qty 1

## 2019-03-08 MED ORDER — HYDROMORPHONE HCL 1 MG/ML IJ SOLN
0.2500 mg | INTRAMUSCULAR | Status: DC | PRN
  Administered 2019-03-08 – 2019-03-09 (×5): 0.25 mg via INTRAVENOUS
  Filled 2019-03-08 (×6): qty 1

## 2019-03-08 MED ORDER — LEVOTHYROXINE SODIUM 75 MCG PO TABS
75.0000 ug | ORAL_TABLET | Freq: Every day | ORAL | Status: DC
  Administered 2019-03-08 – 2019-03-11 (×3): 75 ug via ORAL
  Filled 2019-03-08 (×4): qty 1

## 2019-03-08 MED ORDER — NALOXONE HCL 0.4 MG/ML IJ SOLN
INTRAMUSCULAR | Status: AC
  Filled 2019-03-08: qty 1

## 2019-03-08 MED ORDER — DEXTROSE IN LACTATED RINGERS 5 % IV SOLN
INTRAVENOUS | Status: DC
  Administered 2019-03-08 – 2019-03-10 (×3): via INTRAVENOUS

## 2019-03-08 MED ORDER — ONDANSETRON HCL 4 MG PO TABS: 4.0000 mg | ORAL_TABLET | Freq: Four times a day (QID) | ORAL | Status: DC | PRN

## 2019-03-08 MED ORDER — CIPROFLOXACIN IN D5W 400 MG/200ML IV SOLN
400.0000 mg | Freq: Every day | INTRAVENOUS | Status: DC
  Administered 2019-03-08 – 2019-03-10 (×3): 400 mg via INTRAVENOUS
  Filled 2019-03-08 (×3): qty 200

## 2019-03-08 MED ORDER — SODIUM CHLORIDE 0.9 % IV BOLUS
500.0000 mL | Freq: Once | INTRAVENOUS | Status: AC
Start: 2019-03-08 — End: 2019-03-08
  Administered 2019-03-08: 500 mL via INTRAVENOUS

## 2019-03-08 NOTE — Progress Notes (Signed)
PROGRESS NOTE                                                                                                                                                                                                             Patient Demographics:    Cassandra Castro, is a 85 y.o. female, DOB - 1934-02-20, [id number]  Admit date - 03/08/2019   Admitting Physician Elwyn Reach, MD  Outpatient Primary MD for the patient is Myrlene Broker, MD  LOS - 0   Chief Complaint  Patient presents with   Loss of Consciousness       Brief Narrative    This is a no charge note as patient admitted earlier today by Dr. Jonelle Sidle, chart, imaging and labs were reviewed   Subjective:    Cassandra Castro today complaints of abdominal pain   Assessment  & Plan :    Principal Problem:   Acute pancreatitis Active Problems:   Anxiety   Coronary artery disease involving native coronary artery of native heart with angina pectoris ([place])   Bladder cancer ([place])   Hypertensive heart disease   Hyperlipidemia   Paroxysmal atrial fibrillation ([place])   Stage 3 chronic kidney disease ([place])   Acute duodenitis   Adnexal mass   Syncope    Acute pancreatitis:  - Appears to be groove pancreatitis probably secondary to tomorrow and intra-abdominal lesions.  Also surrounding duodenitis.  Patient will be admitted.  Strict bowel rest.  Pain management and nausea vomiting management.  Follow lipase level and monitor.  Acute duodenitis:  - s per above.  I will add empiric antibiotics.  IV fluids with D5 Ringer's lactate.  Right ovarian cancer:  -Patient is currently followed at [facility] oncology, plan was for liver mass biopsy on 9/23, history of renal cancer, unclear primary malignancy, ovarian versus recurrent renal. - Patient with significant abdominal pain, appears multifactorial, in the setting of pancreatitis, and her malignancy, she received 1 mg Dilaudid this a.m.,  where she had diminished respiratory effort which she required Narcan, I have discussed goals of care with the daughter, and pain management, they are considering hospice, palliative medicine consulted.  Syncope: Probably due to dehydration.  Aggressively hydrate and check PT and OT.  She has cardiac history but this appears to have no acute cardiac decompensation.  Coronary artery  disease: Stable at baseline.  So far enzymes negative.  Hypertensive heart disease: Continue supportive care.  Chronic kidney disease stage III: Continue with home regimen.  Hypothermia: Now temperature is 96. Patient probably early sepsis. Transfer to SDU. Contiue abx. Blood Cx.   Code Status : DNR  Family Communication  : Discussed with daughter via phone  Disposition Plan  : pending further work-up  Barriers For Discharge : remains NPO, on IV fluids and antibiotics  Consults  :  Palliative  Procedures  : None  DVT Prophylaxis  :  [place] heparin  Lab Results  Component Value Date   PLT 378 03/08/2019    Antibiotics  :    Anti-infectives (From admission, onward)   Start     Dose/Rate Route Frequency Ordered Stop   03/08/19 0900  ciprofloxacin (CIPRO) IVPB 400 mg    Note to Pharmacy: Cipro 400 mg IV q24h for CrCl < 30 mL/min   400 mg 200 mL/hr over 60 Minutes Intravenous Daily 03/08/19 0755     03/08/19 0800  metroNIDAZOLE (FLAGYL) IVPB 500 mg     500 mg 100 mL/hr over 60 Minutes Intravenous Every 8 hours 03/08/19 0755          Objective:   Vitals:   03/08/19 0900 03/08/19 1000 03/08/19 1100 03/08/19 1200  BP: (!) 132/57 (!) 141/63 136/66 (!) 140/59  Pulse: 88 89 89 91  Resp:  10 17 13   Temp:  97.6 F (36.4 C)  97.9 F (36.6 C)  TempSrc:  Oral  Oral  SpO2:  97% 95% 100%  Weight:      Height:        Wt Readings from Last 3 Encounters:  03/08/19 77.1 kg  06/27/18 81.6 kg  02/25/18 81.2 kg     Intake/Output Summary (Last 24 hours) at 03/08/2019 1512 Last data filed at  03/08/2019 0935 Gross per 24 hour  Intake 500 ml  Output --  Net 500 ml     Physical Exam  Sleepy, arouses to loud verbal stimuli, alert x2, fused Symmetrical Chest wall movement, managed air entry at the bases with some rails RRR,No Gallops,Rubs or new Murmurs, No Parasternal Heave +ve B.Sounds, Abd mildly distended, mild ascites, tenderness to palpation , No rebound - guarding or rigidity. No Cyanosis, Clubbing or edema, No new Rash or bruise      Data Review:    CBC Recent Labs  Lab 03/08/19 0036 03/08/19 0614 03/08/19 0835  WBC 9.0 8.6 11.5*  HGB 11.3* 11.5* 12.3  HCT 35.3* 37.0 37.9  PLT 354 381 378  MCV 89.4 91.1 88.3  MCH 28.6 28.3 28.7  MCHC 32.0 31.1 32.5  RDW 15.8* 15.9* 15.9*  LYMPHSABS 2.3  --   --   MONOABS 0.6  --   --   EOSABS 0.1  --   --   BASOSABS 0.0  --   --     Chemistries  Recent Labs  Lab 03/08/19 0036 03/08/19 0614  NA 132* 132*  K 3.5 4.0  CL 97* 98  CO2 22 21*  GLUCOSE 168* 167*  BUN 23 23  CREATININE 1.60* 1.46*  CALCIUM 8.5* 7.9*  AST 24 23  ALT 15 13  ALKPHOS 75 69  BILITOT 0.6 0.4   ------------------------------------------------------------------------------------------------------------------ No results for input(s): CHOL, HDL, LDLCALC, TRIG, CHOLHDL, LDLDIRECT in the last 72 hours.  No results found for: HGBA1C ------------------------------------------------------------------------------------------------------------------ No results for input(s): TSH, T4TOTAL, T3FREE, THYROIDAB in the last 72 hours.  Invalid  input(s): FREET3 ------------------------------------------------------------------------------------------------------------------ No results for input(s): VITAMINB12, FOLATE, FERRITIN, TIBC, IRON, RETICCTPCT in the last 72 hours.  Coagulation profile No results for input(s): INR, PROTIME in the last 168 hours.  No results for input(s): DDIMER in the last 72 hours.  Cardiac Enzymes No results for  input(s): CKMB, TROPONINI, MYOGLOBIN in the last 168 hours.  Invalid input(s): CK ------------------------------------------------------------------------------------------------------------------ No results found for: BNP  Inpatient Medications  Scheduled Meds:  amiodarone  50 mg Oral Daily   heparin  5,000 Units Subcutaneous Q8H   insulin aspart  0-9 Units Subcutaneous TID WC   levothyroxine  75 mcg Oral Daily   Continuous Infusions:  ciprofloxacin 400 mg (03/08/19 1031)   dextrose 5% lactated ringers 125 mL/hr at 03/08/19 1013   metronidazole 500 mg (03/08/19 1214)   PRN Meds:.ALPRAZolam, HYDROmorphone (DILAUDID) injection, ondansetron **OR** ondansetron (ZOFRAN) IV  Micro Results Recent Results (from the past 240 hour(s))  SARS Coronavirus 2 [facility] order, Performed in [facility] hospital lab) Nasopharyngeal Nasopharyngeal Swab     Status: None   Collection Time: 03/08/19  2:36 AM   Specimen: Nasopharyngeal Swab  Result Value Ref Range Status   SARS Coronavirus 2 NEGATIVE NEGATIVE Final    Comment: (NOTE) If result is NEGATIVE SARS-CoV-2 target nucleic acids are NOT DETECTED. The SARS-CoV-2 RNA is generally detectable in upper and lower  respiratory specimens during the acute phase of infection. The lowest  concentration of SARS-CoV-2 viral copies this assay can detect is 250  copies / mL. A negative result does not preclude SARS-CoV-2 infection  and should not be used as the sole basis for treatment or other  patient management decisions.  A negative result may occur with  improper specimen collection / handling, submission of specimen other  than nasopharyngeal swab, presence of viral mutation(s) within the  areas targeted by this assay, and inadequate number of viral copies  (<250 copies / mL). A negative result must be combined with clinical  observations, patient history, and epidemiological information. If result is POSITIVE SARS-CoV-2 target nucleic  acids are DETECTED. The SARS-CoV-2 RNA is generally detectable in upper and lower  respiratory specimens dur ing the acute phase of infection.  Positive  results are indicative of active infection with SARS-CoV-2.  Clinical  correlation with patient history and other diagnostic information is  necessary to determine patient infection status.  Positive results do  not rule out bacterial infection or co-infection with other viruses. If result is PRESUMPTIVE POSTIVE SARS-CoV-2 nucleic acids MAY BE PRESENT.   A presumptive positive result was obtained on the submitted specimen  and confirmed on repeat testing.  While 2019 novel coronavirus  (SARS-CoV-2) nucleic acids may be present in the submitted sample  additional confirmatory testing may be necessary for epidemiological  and / or clinical management purposes  to differentiate between  SARS-CoV-2 and other Sarbecovirus currently known to infect humans.  If clinically indicated additional testing with an alternate test  methodology [phone]) is advised. The SARS-CoV-2 RNA is generally  detectable in upper and lower respiratory sp ecimens during the acute  phase of infection. The expected result is Negative. Fact Sheet for Patients:  StrictlyIdeas.no Fact Sheet for Healthcare Providers: BankingDealers.co.za This test is not yet approved or cleared by the [place] FDA and has been authorized for detection and/or diagnosis of SARS-CoV-2 by FDA under an Emergency Use Authorization (EUA).  This EUA will remain in effect (meaning this test can be used) for the duration of the COVID-19 declaration under  Section 564(b)(1) of the Act, 21 U.S.C. section 360bbb-3(b)(1), unless the authorization is terminated or revoked sooner. Performed at [facility], [place] [address] [place], [place] [address]     Radiology Reports Ct Abdomen Pelvis Wo Contrast  Result Date: 03/08/2019 CLINICAL  DATA:  Abdominal distension, syncope during bowel movements EXAM: CT ABDOMEN AND PELVIS WITHOUT CONTRAST TECHNIQUE: Multidetector CT imaging of the abdomen and pelvis was performed following the standard protocol without IV contrast. COMPARISON:  CT 08/18/2018 (no report); CT 12/27/2017 FINDINGS: Lower chest: Postsurgical changes from prior CABG with partial visualization of the sternotomy wires. Multiple mediastinal surgical clips and extensive atherosclerotic calcification of the native vessels. Cardiac pacer/defibrillator leads terminate in right atrium and cardiac apex. No pericardial effusion. Lung bases demonstrate bandlike areas of scarring and/or atelectasis or more dependent ground-glass likely reflecting additional atelectatic change. Hepatobiliary: Multitude hypoattenuating lesions throughout the liver predominantly in the posterior right lobe with the largest lesion measuring 3.9 x 4.0 x 4.7 cm is present in the posterior right lobe liver (segment VI) with a small amount of adjacent intermediate attenuation (24 HU) subcapsular fluid. Additional hypoattenuating lesions are present in segment 4. Dependently layering hyperattenuating material is seen at the gallbladder neck. No calcified intraductal gallstones. No pericholecystic inflammation. Pancreas: There is a small amount of edematous thickening of the pancreatic head and inflammation within pancreaticoduodenal groove. No pancreatic ductal dilatation. Partial fatty replacement of the tail the pancreas. Extensive vascular calcifications are noted. Spleen: Extensive splenic vascular calcification. No concerning splenic lesions. Adrenals/Urinary Tract: Normal adrenal glands. Postsurgical changes from prior right nephrectomy. No abnormal soft tissue in the nephrectomy bed. Left kidney appears slightly atrophic. No concerning left renal lesions. No urolithiasis or hydronephrosis. Slightly distorted contour of the urinary bladder is similar to comparison  radiographs. No abnormal bladder wall thickening. Stomach/Bowel: Distal esophagus is unremarkable. The stomach is free of abnormality. There is circumferential thickening of the second and third portions of the duodenum with adjacent periduodenal stranding and free fluid, particularly within the pancreaticoduodenal groove. No extraluminal gas is identified. The appendix is not identified. No pericecal inflammation. Portion of the transverse colon is protruding into a ventral hernia sac with a 6.8 by 4.0 cm fascial defect. There is irregular circumferential mural thickening near the rectosigmoid junction. Additional circumferential rectal wall thickening is present as well with some adjacent stranding. Vascular/Lymphatic: Atherosclerotic plaque within the normal caliber aorta. Reactive adenopathy in the upper abdomen. Reproductive: Anteverted uterus. Large heterogeneously enhancing lesion appears to arise within the right adnexa, intimate with the terminus of the gonadal veins but also without discernible fat plane from the anterolateral uterus. Lesion is measuring up to 10.6 x 9.4 x 12.9 cm in size with heterogeneous internal attenuation and some coarse calcification. This is enlarged from comparison CT in February of 2020 and new since July of 2019. Additional cystic and calcified foci are present in the left adnexa as well. Other: Large bowel containing hernia anteriorly, as detailed above. Musculoskeletal: No acute osseous abnormality or suspicious osseous lesion. Multilevel degenerative changes are present in the imaged portions of the spine. IMPRESSION: 1. Circumferential thickening of the second and third portions of the duodenum with adjacent periduodenal stranding and free fluid, with some fluid in the pancreaticoduodenal groove and mild edematous thickening of the pancreatic head. Finding could reflect a duodenitis secondary to peptic ulcer disease with reactive pancreatic features versus a groove  pancreatitis. Correlation with lipase is recommended. 2. Large 12.9 cm heterogeneous lesion felt to most likely arise  from the right adnexa but intimate with the lateral aspect of the uterine fundus as well. Markedly enlarged from recent comparison studies. Additional cystic and solid lesion is present in the left adnexa. Findings are concerning adnexal malignancy. Could further assess with dedicated pelvic ultrasound if clinically warranted. 3. Irregular mural thickening of the rectosigmoid junction, concerning for a malignant lesion. Recommend correlation with direct visualization. More circumferential thickening and adjacent inflammation of the distal rectum could reflect a superimposed proctitis. 4. Heterogeneous but predominantly hypoattenuating lesion measuring 3.9 x 4.0 x 4.7 cm in the posterior right lobe liver (segment VI) with a small amount of adjacent intermediate attenuation (24 HU) subcapsular fluid. Findings are concerning for hepatic metastatic disease. 5. Large ventral hernia containing a portion of the transverse colon. No evidence of mechanical obstruction or vascular compromise. 6. Aortic Atherosclerosis (ICD10-I70.0). These results were called by telephone at the time of interpretation on 03/08/2019 at 2:57 am to provider [facility] , who verbally acknowledged these results. Electronically Signed   By: Lovena Le M.D.   On: 03/08/2019 02:57   Dg Chest Portable 1 View  Result Date: 03/08/2019 CLINICAL DATA:  Syncope. EXAM: PORTABLE CHEST 1 VIEW COMPARISON:  Radiograph 01/27/2019. chest CTA 01/05/2019 FINDINGS: Low lung volumes. 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 sternotomy and CABG. Blunting of the left costophrenic angle with associated peripheral opacities. Vascular congestion without definite edema. No pneumothorax. No acute osseous abnormalities are seen. Chronic deformity of the right proximal humerus. IMPRESSION: Left basilar opacities similar to prior exams, favor  chronic atelectasis/scarring. Low lung volumes with vascular congestion. Electronically Signed   By: Keith Rake M.D.   On: 03/08/2019 01:35    Time Spent in minutes  : No charge   Phillips Climes M.D on 03/08/2019 at 3:12 PM  Between 7am to 7pm - Pager - [phone]  After 7pm go to [email or web address] - password [facility]  [facility] -  Office  [phone]

## 2019-03-08 NOTE — ED Triage Notes (Signed)
Pt BIB [facility] after syncopal episode while having bowel movement. Family at pt's side assisted pt to floor. No injuries noted. Denies head injury.   Abdominal pain RUQ, and mid noted. Abdomin distended. Extensive HX: Hernia, Liver, and ovarian cystis. Abdominal pain present for past 2 months. Dark stools x2 days followed by constipation x2 days   Pt pale with poor cap refill at triage.   EMS VS HR 70s pacemaker  BP 142/68  SpO2 89% room air, 2 L O2 94-95s, no home O2  4 mg Zofran and 50 mcg fentanyl given by EMS

## 2019-03-08 NOTE — ED Provider Notes (Signed)
[facility] EMERGENCY DEPARTMENT Provider Note   CSN: [id number] Arrival date & time: 03/08/19  0030     History   Chief Complaint Chief Complaint  Patient presents with   Loss of Consciousness    HPI Cassandra Castro is a 85 y.o. female.     Patient to ED after EMS called to home for evaluation of syncopal event while in the bathroom for a bowel movement. Per EMS, the patient had 2 days of diarrhea with subsequent 2 days of constipation. No known fever, respiratory symptoms, chest pain, headache or known head injury. The patient complains of abdominal pain - has known ventral hernia and "spots" on ovaries and liver that cause pain usually. Family reported to EMS that stools appeared dark 2 days ago when having diarrhea. She has had nausea without vomiting at any time. Per EMS, the patient dropped her O2 saturations while waking up from syncopal episode, not on home O2, and received 50 mcg Fentanyl with further drops in O2 saturations and significant drowsiness.  The history is provided by the patient and the EMS personnel. No language interpreter was used.  Loss of Consciousness Associated symptoms: nausea and weakness   Associated symptoms: no chest pain, no fever and no vomiting     Past Medical History:  Diagnosis Date   Anxiety 02/18/2015   Atherosclerotic heart disease of native coronary artery without angina pectoris 02/14/2015   Bladder cancer ([place]) 02/18/2015   Blood transfusion without reported diagnosis    Cataract    CHF (congestive heart failure) ([place])    Diabetes mellitus without complication ([place])    Essential hypertension 02/14/2015   Hyperlipidemia 02/14/2015   Myocardial infarction [facility])    Osteoporosis    Paroxysmal atrial fibrillation ([place]) 02/14/2015   Presence of aortocoronary bypass graft 02/14/2015   Presence of cardiac pacemaker 02/15/2015   SSS (sick sinus syndrome) ([place]) 12/01/2015   Stage 3 chronic kidney disease ([place])  04/22/2016   Stroke ([place])    Thyroid disease    TIA (transient ischemic attack)     Patient Active Problem List   Diagnosis Date Noted   Acute duodenitis 03/08/2019   Adnexal mass 03/08/2019   Acute pancreatitis 03/08/2019   Syncope 03/08/2019   On amiodarone therapy 02/25/2018   Acute renal failure ([place]) 01/10/2018   Chronic anxiety [id number]   Metabolic encephalopathy [id number]   Obesity with body mass index 30 or greater 12/27/2017   Weakness of both lower extremities 09/27/2017   Stage 3 chronic kidney disease ([place]) 04/22/2016   SSS (sick sinus syndrome) ([place]) 12/01/2015   Anxiety 02/18/2015   Bladder cancer ([place]) 02/18/2015   Presence of cardiac pacemaker 02/15/2015   Coronary artery disease involving native coronary artery of native heart with angina pectoris ([place]) 02/14/2015   Hypertensive heart disease 02/14/2015   Hyperlipidemia 02/14/2015   Paroxysmal atrial fibrillation ([place]) 02/14/2015   Presence of aortocoronary bypass graft 02/14/2015   Pure hypercholesterolemia 02/14/2015    Past Surgical History:  Procedure Laterality Date   BACK SURGERY     BLADDER SURGERY     CAROTID ENDARTERECTOMY     CORONARY ARTERY BYPASS GRAFT     EYE SURGERY     INSERT / REPLACE / REMOVE PACEMAKER     IR RETROGRADE PYELOGRAM     NEPHROURETERECTOMY     TRANSURETHRAL RESECTION OF BLADDER       OB History   No obstetric history on file.      Home Medications  Prior to Admission medications   Medication Sig Start Date End Date Taking? Authorizing Provider  albuterol (PROVENTIL HFA;VENTOLIN HFA) 108 (90 Base) MCG/ACT inhaler Inhale 2 puffs into the lungs every 4 (four) hours as needed. 03/30/16 02/09/19  [provider]  ALPRAZolam Duanne Moron) 0.5 MG tablet Take 1 tablet by mouth 3 (three) times daily as needed for anxiety.  12/18/17   [provider]  amiodarone (PACERONE) 100 MG tablet Take 0.5 tablets by mouth daily.      [provider]  amitriptyline (ELAVIL) 50 MG tablet Take 50 mg by mouth at bedtime. 12/02/17   [provider]  CARTIA XT 120 MG 24 hr capsule Take 120 mg by mouth daily. 01/04/18   [provider]  clopidogrel (PLAVIX) 75 MG tablet Take 0.5 tablets (37.5 mg total) by mouth daily. 05/27/18   Richardo Priest, MD  furosemide (LASIX) 40 MG tablet TAKE 1 TABLET BY MOUTH ONCE DAILY EXCEPT TAKE 2 TABLETS ON MONDAY,WEDNESDAY AND FRIDAY. 08/20/18   Richardo Priest, MD  gabapentin (NEURONTIN) 800 MG tablet Take 1 tablet by mouth 4 (four) times daily. 01/31/15   [provider]  levothyroxine (SYNTHROID, LEVOTHROID) 75 MCG tablet Take 1 tablet by mouth daily. 12/17/17   [provider]  Multiple Vitamin (MULTI-VITAMINS) TABS Take 1 tablet by mouth daily.    [provider]  polyethylene glycol (MIRALAX / GLYCOLAX) packet Take 17 g by mouth daily.    [provider]  potassium chloride SA (K-DUR,KLOR-CON) 20 MEQ tablet Take 1 tablet by mouth daily. 06/07/17   [provider]  rosuvastatin (CRESTOR) 20 MG tablet Take 20 mg by mouth daily.    [provider]    Family History Family History  Problem Relation Age of Onset   Diabetes Maternal Grandfather    Heart disease Maternal Grandfather    Huntington's disease Mother     Social History Social History   Tobacco Use   Smoking status: Never Smoker   Smokeless tobacco: Never Used  Substance Use Topics   Alcohol use: Not Currently   Drug use: Never     Allergies   Amoxicillin, Amoxicillin-pot clavulanate, Atorvastatin, Clavulanic acid, Hydrocodone, Codeine, Latex, Promethazine, Sulfa antibiotics, Tape, and Tramadol   Review of Systems Review of Systems  Reason unable to perform ROS: Patient is a questionable historian.   Constitutional: Negative for chills and fever.  HENT: Negative.   Respiratory: Negative.  Negative for cough.   Cardiovascular: Positive  for syncope. Negative for chest pain.  Gastrointestinal: Positive for abdominal pain, constipation, diarrhea and nausea. Negative for vomiting.  Musculoskeletal: Positive for back pain.  Skin: Negative.   Neurological: Positive for syncope and weakness.     Physical Exam Updated Vital Signs BP (!) 143/59    Pulse 85    Temp (!) 97.3 F (36.3 C) (Rectal)    Resp 18    Ht 5\' 1"  (1.549 m)    Wt 77.1 kg    SpO2 96%    BMI 32.12 kg/m   Physical Exam Nursing note reviewed.  Constitutional:      Appearance: She is obese. She is not toxic-appearing.  HENT:     Head: Atraumatic.     Nose: Nose normal.     Mouth/Throat:     Mouth: Mucous membranes are dry.  Eyes:     Pupils: Pupils are equal, round, and reactive to light.     Comments: Conjunctival pallor  Cardiovascular:     Rate  and Rhythm: Normal rate and regular rhythm.  Pulmonary:     Effort: Pulmonary effort is normal.     Breath sounds: No wheezing, rhonchi or rales.     Comments: Poor effort. Abdominal:     General: Bowel sounds are normal.     Palpations: Abdomen is soft. There is no mass.     Tenderness: There is abdominal tenderness.     Hernia: A hernia is present.  Musculoskeletal: Normal range of motion.  Skin:    General: Skin is warm and dry.      ED Treatments / Results  Labs (all labs ordered are listed, but only abnormal results are displayed) Labs Reviewed  CBC WITH DIFFERENTIAL/PLATELET - Abnormal; Notable for the following components:      Result Value   Hemoglobin 11.3 (*)    HCT 35.3 (*)    RDW 15.8 (*)    Abs Immature Granulocytes 0.12 (*)    All other components within normal limits  COMPREHENSIVE METABOLIC PANEL - Abnormal; Notable for the following components:   Sodium 132 (*)    Chloride 97 (*)    Glucose, Bld 168 (*)    Creatinine, Ser 1.60 (*)    Calcium 8.5 (*)    Total Protein 6.4 (*)    GFR calc non Af Amer 29 (*)    GFR calc Af Amer 34 (*)    All other components within normal  limits  LIPASE, BLOOD - Abnormal; Notable for the following components:   Lipase 2,181 (*)    All other components within normal limits  LACTIC ACID, PLASMA - Abnormal; Notable for the following components:   Lactic Acid, Venous 2.5 (*)    All other components within normal limits  URINE CULTURE  SARS CORONAVIRUS 2 (HOSPITAL ORDER, [facility])  URINALYSIS, ROUTINE W REFLEX MICROSCOPIC  LACTIC ACID, PLASMA  POC OCCULT BLOOD, ED  CBG MONITORING, ED  TYPE AND SCREEN  ABO/RH  TROPONIN I (HIGH SENSITIVITY)  TROPONIN I (HIGH SENSITIVITY)   Results for orders placed or performed during the hospital encounter of 03/08/19  CBC with Differential  Result Value Ref Range   WBC 9.0 4.0 - 10.5 K/uL   RBC 3.95 3.87 - 5.11 MIL/uL   Hemoglobin 11.3 (L) 12.0 - 15.0 g/dL   HCT 35.3 (L) 36.0 - 46.0 %   MCV 89.4 80.0 - 100.0 fL   MCH 28.6 26.0 - 34.0 pg   MCHC 32.0 30.0 - 36.0 g/dL   RDW 15.8 (H) 11.5 - 15.5 %   Platelets 354 150 - 400 K/uL   nRBC 0.0 0.0 - 0.2 %   Neutrophils Relative % 66 %   Neutro Abs 5.9 1.7 - 7.7 K/uL   Lymphocytes Relative 26 %   Lymphs Abs 2.3 0.7 - 4.0 K/uL   Monocytes Relative 6 %   Monocytes Absolute 0.6 0.1 - 1.0 K/uL   Eosinophils Relative 1 %   Eosinophils Absolute 0.1 0.0 - 0.5 K/uL   Basophils Relative 0 %   Basophils Absolute 0.0 0.0 - 0.1 K/uL   Immature Granulocytes 1 %   Abs Immature Granulocytes 0.12 (H) 0.00 - 0.07 K/uL  Comprehensive metabolic panel  Result Value Ref Range   Sodium 132 (L) 135 - 145 mmol/L   Potassium 3.5 3.5 - 5.1 mmol/L   Chloride 97 (L) 98 - 111 mmol/L   CO2 22 22 - 32 mmol/L   Glucose, Bld 168 (H) 70 - 99 mg/dL  BUN 23 8 - 23 mg/dL   Creatinine, Ser 1.60 (H) 0.44 - 1.00 mg/dL   Calcium 8.5 (L) 8.9 - 10.3 mg/dL   Total Protein 6.4 (L) 6.5 - 8.1 g/dL   Albumin 3.5 3.5 - 5.0 g/dL   AST 24 15 - 41 U/L   ALT 15 0 - 44 U/L   Alkaline Phosphatase 75 38 - 126 U/L   Total Bilirubin 0.6 0.3 - 1.2 mg/dL    GFR calc non Af Amer 29 (L) >60 mL/min   GFR calc Af Amer 34 (L) >60 mL/min   Anion gap 13 5 - 15  Lipase, blood  Result Value Ref Range   Lipase 2,181 (H) 11 - 51 U/L  Lactic acid, plasma  Result Value Ref Range   Lactic Acid, Venous 2.5 (HH) 0.5 - 1.9 mmol/L  POC occult blood, ED  Result Value Ref Range   Fecal Occult Bld NEGATIVE NEGATIVE  Type and screen [place]  Result Value Ref Range   ABO/RH(D) O POS    Antibody Screen NEG    Sample Expiration      03/11/2019,2359 Performed at [facility], [address], [place], [place] [address]   ABO/Rh  Result Value Ref Range   ABO/RH(D)      O POS Performed at [facility] [address] [place], [place] [address]   Troponin I (High Sensitivity)  Result Value Ref Range   Troponin I (High Sensitivity) 8 <18 ng/L  Troponin I (High Sensitivity)  Result Value Ref Range   Troponin I (High Sensitivity) 7 <18 ng/L    EKG None  Radiology Ct Abdomen Pelvis Wo Contrast  Result Date: 03/08/2019 CLINICAL DATA:  Abdominal distension, syncope during bowel movements EXAM: CT ABDOMEN AND PELVIS WITHOUT CONTRAST TECHNIQUE: Multidetector CT imaging of the abdomen and pelvis was performed following the standard protocol without IV contrast. COMPARISON:  CT 08/18/2018 (no report); CT 12/27/2017 FINDINGS: Lower chest: Postsurgical changes from prior CABG with partial visualization of the sternotomy wires. Multiple mediastinal surgical clips and extensive atherosclerotic calcification of the native vessels. Cardiac pacer/defibrillator leads terminate in right atrium and cardiac apex. No pericardial effusion. Lung bases demonstrate bandlike areas of scarring and/or atelectasis or more dependent ground-glass likely reflecting additional atelectatic change. Hepatobiliary: Multitude hypoattenuating lesions throughout the liver predominantly in the posterior right lobe with the largest lesion measuring 3.9 x 4.0 x 4.7  cm is present in the posterior right lobe liver (segment VI) with a small amount of adjacent intermediate attenuation (24 HU) subcapsular fluid. Additional hypoattenuating lesions are present in segment 4. Dependently layering hyperattenuating material is seen at the gallbladder neck. No calcified intraductal gallstones. No pericholecystic inflammation. Pancreas: There is a small amount of edematous thickening of the pancreatic head and inflammation within pancreaticoduodenal groove. No pancreatic ductal dilatation. Partial fatty replacement of the tail the pancreas. Extensive vascular calcifications are noted. Spleen: Extensive splenic vascular calcification. No concerning splenic lesions. Adrenals/Urinary Tract: Normal adrenal glands. Postsurgical changes from prior right nephrectomy. No abnormal soft tissue in the nephrectomy bed. Left kidney appears slightly atrophic. No concerning left renal lesions. No urolithiasis or hydronephrosis. Slightly distorted contour of the urinary bladder is similar to comparison radiographs. No abnormal bladder wall thickening. Stomach/Bowel: Distal esophagus is unremarkable. The stomach is free of abnormality. There is circumferential thickening of the second and third portions of the duodenum with adjacent periduodenal stranding and free fluid, particularly within the pancreaticoduodenal groove. No extraluminal gas  is identified. The appendix is not identified. No pericecal inflammation. Portion of the transverse colon is protruding into a ventral hernia sac with a 6.8 by 4.0 cm fascial defect. There is irregular circumferential mural thickening near the rectosigmoid junction. Additional circumferential rectal wall thickening is present as well with some adjacent stranding. Vascular/Lymphatic: Atherosclerotic plaque within the normal caliber aorta. Reactive adenopathy in the upper abdomen. Reproductive: Anteverted uterus. Large heterogeneously enhancing lesion appears to arise  within the right adnexa, intimate with the terminus of the gonadal veins but also without discernible fat plane from the anterolateral uterus. Lesion is measuring up to 10.6 x 9.4 x 12.9 cm in size with heterogeneous internal attenuation and some coarse calcification. This is enlarged from comparison CT in February of 2020 and new since July of 2019. Additional cystic and calcified foci are present in the left adnexa as well. Other: Large bowel containing hernia anteriorly, as detailed above. Musculoskeletal: No acute osseous abnormality or suspicious osseous lesion. Multilevel degenerative changes are present in the imaged portions of the spine. IMPRESSION: 1. Circumferential thickening of the second and third portions of the duodenum with adjacent periduodenal stranding and free fluid, with some fluid in the pancreaticoduodenal groove and mild edematous thickening of the pancreatic head. Finding could reflect a duodenitis secondary to peptic ulcer disease with reactive pancreatic features versus a groove pancreatitis. Correlation with lipase is recommended. 2. Large 12.9 cm heterogeneous lesion felt to most likely arise from the right adnexa but intimate with the lateral aspect of the uterine fundus as well. Markedly enlarged from recent comparison studies. Additional cystic and solid lesion is present in the left adnexa. Findings are concerning adnexal malignancy. Could further assess with dedicated pelvic ultrasound if clinically warranted. 3. Irregular mural thickening of the rectosigmoid junction, concerning for a malignant lesion. Recommend correlation with direct visualization. More circumferential thickening and adjacent inflammation of the distal rectum could reflect a superimposed proctitis. 4. Heterogeneous but predominantly hypoattenuating lesion measuring 3.9 x 4.0 x 4.7 cm in the posterior right lobe liver (segment VI) with a small amount of adjacent intermediate attenuation (24 HU) subcapsular fluid.  Findings are concerning for hepatic metastatic disease. 5. Large ventral hernia containing a portion of the transverse colon. No evidence of mechanical obstruction or vascular compromise. 6. Aortic Atherosclerosis (ICD10-I70.0). These results were called by telephone at the time of interpretation on 03/08/2019 at 2:57 am to provider [facility] Minnie Legros , who verbally acknowledged these results. Electronically Signed   By: Lovena Le M.D.   On: 03/08/2019 02:57   Dg Chest Portable 1 View  Result Date: 03/08/2019 CLINICAL DATA:  Syncope. EXAM: PORTABLE CHEST 1 VIEW COMPARISON:  Radiograph 01/27/2019. chest CTA 01/05/2019 FINDINGS: Low lung volumes. 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 sternotomy and CABG. Blunting of the left costophrenic angle with associated peripheral opacities. Vascular congestion without definite edema. No pneumothorax. No acute osseous abnormalities are seen. Chronic deformity of the right proximal humerus. IMPRESSION: Left basilar opacities similar to prior exams, favor chronic atelectasis/scarring. Low lung volumes with vascular congestion. Electronically Signed   By: Keith Rake M.D.   On: 03/08/2019 01:35    Procedures Procedures (including critical care time)  Medications Ordered in ED Medications  sodium chloride 0.9 % bolus 500 mL (0 mLs Intravenous Stopped 03/08/19 0239)  fentaNYL (SUBLIMAZE) injection 50 mcg (50 mcg Intravenous Given 03/08/19 0250)  fentaNYL (SUBLIMAZE) injection 50 mcg (50 mcg Intravenous Given 03/08/19 0437)     Initial Impression / Assessment and Plan /  ED Course  I have reviewed the triage vital signs and the nursing notes.  Pertinent labs & imaging results that were available during my care of the patient were reviewed by me and considered in my medical decision making (see chart for details).        Patient to ED after syncopal episode while on the toilet tonight for a bowel movement. No injury in the fall to the floor.    The patient complains of abdominal pain. She states she has a known hernia and ovary mass on the right. She denies nausea or vomiting. No known fever. The patient is alert, oriented and felt to be reliable historian. She reports she made the decision to be DNR on last hospitalization.   Lab studies significant for mild AKI 1.60, lipase grossly elevated at 2181, mild hyponatremia, no leukocytosis. VSS. She had a rectal temperature of 96.7 - improved with Bair hugger. No hypoxic episodes in the ED on 2L O2.   Pain controlled with 50 mcg  Fentanyl. CT abdomen/pelvis with significant abnormalities as noted on radiology read. Of note are findings c/w pancreatitis which correlates with pain and with elevated lipase. No mention of abscess.   Discussed admission with Dr. Barbette Merino, [facility], who accepts the patient for admission. Patient updated on plan.   Final Clinical Impressions(s) / ED Diagnoses   Final diagnoses:  Syncope and collapse  Other acute pancreatitis without infection or necrosis  Adnexal mass    ED Discharge Orders    None       Charlann Lange, PA-C 03/08/19 0456    Mesner, Corene Cornea, MD 03/08/19 [phone]

## 2019-03-08 NOTE — Consult Note (Signed)
Palliative Medicine   Name: Cassandra Castro Date: 03/08/2019 MRN: [id number]  DOB: [birth year]  Patient Care Team: Myrlene Broker, MD as PCP - General (Family Medicine)    REASON FOR CONSULTATION: Palliative Care consult requested for this 85 y.o. female with multiple medical problems including history of high-grade urothelial carcinoma status post right nephroureterectomy on 04/11/2015 who was found to have bilateral ovarian masses and multiple liver lesions on routine surveillance CT.  Patient has been followed at [facility] and saw GYN oncology on 02/26/2019 with plan for biopsy later this month.  She is now admitted 03/08/2019 with abdominal pain and probable pancreatitis.  Abdominal CT here shows duodenal thickening and periduodenal stranding with mild edematous thickening to the pancreatic head suggestive of groove pancreatitis versus duodenitis.  Her right adnexal mass also demonstrated interval enlargement.  Palliative care was consulted to help address goals.   SOCIAL HISTORY:     reports that she has never smoked. She has never used smokeless tobacco. She reports previous alcohol use. She reports that she does not use drugs.   Patient is married and lives at home with her husband.  She had 2 sons and 3 daughters but 1 son is now deceased.  ADVANCE DIRECTIVES:  Not on file  CODE STATUS: DNR  PAST MEDICAL HISTORY: Past Medical History:  Diagnosis Date  . Anxiety 02/18/2015  . Atherosclerotic heart disease of native coronary artery without angina pectoris 02/14/2015  . Bladder cancer ([place]) 02/18/2015  . Blood transfusion without reported diagnosis   . Cataract   . CHF (congestive heart failure) ([place])   . Diabetes mellitus without complication ([place])   . Essential hypertension 02/14/2015  . Hyperlipidemia 02/14/2015  . Myocardial infarction ([place])   . Osteoporosis   . Paroxysmal atrial fibrillation ([place]) 02/14/2015  . Presence of aortocoronary bypass graft  02/14/2015  . Presence of cardiac pacemaker 02/15/2015  . SSS (sick sinus syndrome) ([place]) 12/01/2015  . Stage 3 chronic kidney disease ([place]) 04/22/2016  . Stroke ([place])   . Thyroid disease   . TIA (transient ischemic attack)     PAST SURGICAL HISTORY:  Past Surgical History:  Procedure Laterality Date  . BACK SURGERY    . BLADDER SURGERY    . CAROTID ENDARTERECTOMY    . CORONARY ARTERY BYPASS GRAFT    . EYE SURGERY    . INSERT / REPLACE / REMOVE PACEMAKER    . IR RETROGRADE PYELOGRAM    . NEPHROURETERECTOMY    . TRANSURETHRAL RESECTION OF BLADDER      HEMATOLOGY/ONCOLOGY HISTORY:  Oncology History   No history exists.    ALLERGIES:  is allergic to amoxicillin; amoxicillin-pot clavulanate; atorvastatin; clavulanic acid; hydrocodone; codeine; latex; promethazine; sulfa antibiotics; tape; and tramadol.  MEDICATIONS:  Current Facility-Administered Medications  Medication Dose Route Frequency Provider Last Rate Last Dose  . ALPRAZolam (XANAX) tablet 0.5 mg  0.5 mg Oral TID PRN Elgergawy, Silver Huguenin, MD      . amiodarone (PACERONE) tablet 50 mg  50 mg Oral Daily Elgergawy, Silver Huguenin, MD      . ciprofloxacin (CIPRO) IVPB 400 mg  400 mg Intravenous Daily Gala Romney L, MD 200 mL/hr at 03/08/19 1031 400 mg at 03/08/19 1031  . dextrose 5 % in lactated ringers infusion   Intravenous Continuous Gala Romney L, MD 125 mL/hr at 03/08/19 1013    . heparin injection 5,000 Units  5,000 Units Subcutaneous Q8H Elwyn Reach, MD      .  HYDROmorphone (DILAUDID) injection 0.25-0.5 mg  0.25-0.5 mg Intravenous Q4H PRN Elgergawy, Silver Huguenin, MD      . insulin aspart (novoLOG) injection 0-9 Units  0-9 Units Subcutaneous TID WC Elwyn Reach, MD   2 Units at 03/08/19 1218  . levothyroxine (SYNTHROID) tablet 75 mcg  75 mcg Oral Daily Elgergawy, Dawood S, MD      . metroNIDAZOLE (FLAGYL) IVPB 500 mg  500 mg Intravenous Q8H Garba, Mohammad L, MD 100 mL/hr at 03/08/19 1214 500 mg at 03/08/19 1214  .  ondansetron (ZOFRAN) tablet 4 mg  4 mg Oral Q6H PRN Elwyn Reach, MD       Or  . ondansetron (ZOFRAN) injection 4 mg  4 mg Intravenous Q6H PRN Elwyn Reach, MD        VITAL SIGNS: BP (!) 140/59 (BP Location: Left Arm)   Pulse 91   Temp 97.9 F (36.6 C) (Oral)   Resp 13   Ht '5\' 1"'$  (1.549 m)   Wt 170 lb (77.1 kg)   SpO2 100%   BMI 32.12 kg/m  Filed Weights   03/08/19 0037  Weight: 170 lb (77.1 kg)    Estimated body mass index is 32.12 kg/m as calculated from the following:   Height as of this encounter: '5\' 1"'$  (1.549 m).   Weight as of this encounter: 170 lb (77.1 kg).  LABS: CBC:    Component Value Date/Time   WBC 11.5 (H) 03/08/2019 0835   HGB 12.3 03/08/2019 0835   HCT 37.9 03/08/2019 0835   PLT 378 03/08/2019 0835   MCV 88.3 03/08/2019 0835   NEUTROABS 5.9 03/08/2019 0036   LYMPHSABS 2.3 03/08/2019 0036   MONOABS 0.6 03/08/2019 0036   EOSABS 0.1 03/08/2019 0036   BASOSABS 0.0 03/08/2019 0036   Comprehensive Metabolic Panel:    Component Value Date/Time   NA 132 (L) 03/08/2019 0614   K 4.0 03/08/2019 0614   CL 98 03/08/2019 0614   CO2 21 (L) 03/08/2019 0614   BUN 23 03/08/2019 0614   CREATININE 1.46 (H) 03/08/2019 0614   GLUCOSE 167 (H) 03/08/2019 0614   CALCIUM 7.9 (L) 03/08/2019 0614   AST 23 03/08/2019 0614   ALT 13 03/08/2019 0614   ALKPHOS 69 03/08/2019 0614   BILITOT 0.4 03/08/2019 0614   PROT 6.2 (L) 03/08/2019 0614   ALBUMIN 3.3 (L) 03/08/2019 0614    RADIOGRAPHIC STUDIES: Ct Abdomen Pelvis Wo Contrast  Result Date: 03/08/2019 CLINICAL DATA:  Abdominal distension, syncope during bowel movements EXAM: CT ABDOMEN AND PELVIS WITHOUT CONTRAST TECHNIQUE: Multidetector CT imaging of the abdomen and pelvis was performed following the standard protocol without IV contrast. COMPARISON:  CT 08/18/2018 (no report); CT 12/27/2017 FINDINGS: Lower chest: Postsurgical changes from prior CABG with partial visualization of the sternotomy wires. Multiple  mediastinal surgical clips and extensive atherosclerotic calcification of the native vessels. Cardiac pacer/defibrillator leads terminate in right atrium and cardiac apex. No pericardial effusion. Lung bases demonstrate bandlike areas of scarring and/or atelectasis or more dependent ground-glass likely reflecting additional atelectatic change. Hepatobiliary: Multitude hypoattenuating lesions throughout the liver predominantly in the posterior right lobe with the largest lesion measuring 3.9 x 4.0 x 4.7 cm is present in the posterior right lobe liver (segment VI) with a small amount of adjacent intermediate attenuation (24 HU) subcapsular fluid. Additional hypoattenuating lesions are present in segment 4. Dependently layering hyperattenuating material is seen at the gallbladder neck. No calcified intraductal gallstones. No pericholecystic inflammation. Pancreas: There is a small  amount of edematous thickening of the pancreatic head and inflammation within pancreaticoduodenal groove. No pancreatic ductal dilatation. Partial fatty replacement of the tail the pancreas. Extensive vascular calcifications are noted. Spleen: Extensive splenic vascular calcification. No concerning splenic lesions. Adrenals/Urinary Tract: Normal adrenal glands. Postsurgical changes from prior right nephrectomy. No abnormal soft tissue in the nephrectomy bed. Left kidney appears slightly atrophic. No concerning left renal lesions. No urolithiasis or hydronephrosis. Slightly distorted contour of the urinary bladder is similar to comparison radiographs. No abnormal bladder wall thickening. Stomach/Bowel: Distal esophagus is unremarkable. The stomach is free of abnormality. There is circumferential thickening of the second and third portions of the duodenum with adjacent periduodenal stranding and free fluid, particularly within the pancreaticoduodenal groove. No extraluminal gas is identified. The appendix is not identified. No pericecal  inflammation. Portion of the transverse colon is protruding into a ventral hernia sac with a 6.8 by 4.0 cm fascial defect. There is irregular circumferential mural thickening near the rectosigmoid junction. Additional circumferential rectal wall thickening is present as well with some adjacent stranding. Vascular/Lymphatic: Atherosclerotic plaque within the normal caliber aorta. Reactive adenopathy in the upper abdomen. Reproductive: Anteverted uterus. Large heterogeneously enhancing lesion appears to arise within the right adnexa, intimate with the terminus of the gonadal veins but also without discernible fat plane from the anterolateral uterus. Lesion is measuring up to 10.6 x 9.4 x 12.9 cm in size with heterogeneous internal attenuation and some coarse calcification. This is enlarged from comparison CT in February of 2020 and new since July of 2019. Additional cystic and calcified foci are present in the left adnexa as well. Other: Large bowel containing hernia anteriorly, as detailed above. Musculoskeletal: No acute osseous abnormality or suspicious osseous lesion. Multilevel degenerative changes are present in the imaged portions of the spine. IMPRESSION: 1. Circumferential thickening of the second and third portions of the duodenum with adjacent periduodenal stranding and free fluid, with some fluid in the pancreaticoduodenal groove and mild edematous thickening of the pancreatic head. Finding could reflect a duodenitis secondary to peptic ulcer disease with reactive pancreatic features versus a groove pancreatitis. Correlation with lipase is recommended. 2. Large 12.9 cm heterogeneous lesion felt to most likely arise from the right adnexa but intimate with the lateral aspect of the uterine fundus as well. Markedly enlarged from recent comparison studies. Additional cystic and solid lesion is present in the left adnexa. Findings are concerning adnexal malignancy. Could further assess with dedicated pelvic  ultrasound if clinically warranted. 3. Irregular mural thickening of the rectosigmoid junction, concerning for a malignant lesion. Recommend correlation with direct visualization. More circumferential thickening and adjacent inflammation of the distal rectum could reflect a superimposed proctitis. 4. Heterogeneous but predominantly hypoattenuating lesion measuring 3.9 x 4.0 x 4.7 cm in the posterior right lobe liver (segment VI) with a small amount of adjacent intermediate attenuation (24 HU) subcapsular fluid. Findings are concerning for hepatic metastatic disease. 5. Large ventral hernia containing a portion of the transverse colon. No evidence of mechanical obstruction or vascular compromise. 6. Aortic Atherosclerosis (ICD10-I70.0). These results were called by telephone at the time of interpretation on 03/08/2019 at 2:57 am to provider [facility] , who verbally acknowledged these results. Electronically Signed   By: Lovena Le M.D.   On: 03/08/2019 02:57   Dg Chest Portable 1 View  Result Date: 03/08/2019 CLINICAL DATA:  Syncope. EXAM: PORTABLE CHEST 1 VIEW COMPARISON:  Radiograph 01/27/2019. chest CTA 01/05/2019 FINDINGS: Low lung volumes. 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  sternotomy and CABG. Blunting of the left costophrenic angle with associated peripheral opacities. Vascular congestion without definite edema. No pneumothorax. No acute osseous abnormalities are seen. Chronic deformity of the right proximal humerus. IMPRESSION: Left basilar opacities similar to prior exams, favor chronic atelectasis/scarring. Low lung volumes with vascular congestion. Electronically Signed   By: Keith Rake M.D.   On: 03/08/2019 01:35    PERFORMANCE STATUS (ECOG) : 3 - Symptomatic, >50% confined to bed  Review of Systems Unable to provide  Physical Exam General: Ill-appearing Cardiovascular: regular rate and rhythm Pulmonary: clear ant fields Abdomen: soft, nontender, + bowel sounds  GU: no suprapubic tenderness Extremities: no edema, no joint deformities Skin: no rashes Neurological: Wakes when stimulated but is confused  IMPRESSION: Patient wakes when stimulated but is confused.  She has had both fentanyl and hydromorphone and family say she is extremely sensitive to opioids.  I met in person with patient's husband and granddaughter.  I spoke by phone with patient's daughter, [name], who reportedly is patient's HCPOA. Together, we reviewed patient's current medical problems. Family is in agreement with the current scope of treatment and giving patient time to see if she improves.   Patient has told family that she is not interested in any type of treatment for presumed cancer. Specifically, she would not want chemotherapy or radiation. She would not likely be a candidate for surgery and patient would also not want invasive treatments. Note that patient has not seen medical oncology. There was a plan for liver biopsy scheduled in 2 weeks.  Patient had wanted to pursue the biopsy for informational purposes even if she did not receive treatment. Family feel that patient should forgo the biopsy and just focus on comfort and quality of life.   We did discuss the option of hospice involvement at length. Family seem interested in the idea of hospice but recognize that hospice would likely delay start of services if patient opts to pursue biopsy. Family are hopeful that patient will be able to participate in a discussion about her goals when she is feeling better.   PLAN: -Continue current scope of treatment -Best supportive care -Family considering hospice involvement -Will follow    Time Total: 60 minutes  Visit consisted of counseling and education dealing with the complex and emotionally intense issues of symptom management and palliative care in the setting of serious and potentially life-threatening illness.Greater than 50%  of this time was spent counseling and  coordinating care related to the above assessment and plan.  Signed by: Altha Harm, PhD, NP-C [phone] (Work Cell)

## 2019-03-08 NOTE — ED Notes (Signed)
Patient transported to CT 

## 2019-03-08 NOTE — H&P (Addendum)
History and Physical   Cassandra Castro [id number] DOB: 04/07/1934 DOA: 03/08/2019  Referring MD/NP/PA: Dr. Dayna Barker  PCP: Myrlene Broker, MD   Outpatient Specialists: At [facility]  Patient coming from: Home  Chief Complaint: Syncope and abdominal pain  HPI: Cassandra Castro is a 85 y.o. female with medical history significant of right ovarian cancer, bladder cancer, diabetes, hypertension, CHF, history of CVA, hypothyroidism, paroxysmal atrial fibrillation with pacemaker in place, who is a DNR and has been following with oncology within the [facility] for her adnexal tumor on the right with possible metastasis coming to the ER today with complaint of sustaining a syncopal episode.  Episode happened while she was in the bathroom.  She has been having significant diarrhea for about 2 days.  This was interspersed with some constipation.  She is also denied vomiting.  Patient has had significant worsening of her abdominal pain and symptoms.  She did have dark stools when she was having the diarrhea.  Not sure if this is bloody.  After the syncopal episode today family called EMS and she was transported to the ER.  She was slightly hypoxic at the beginning.  She has cardiac history as indicated but no chest pain no palpitations and they have been stable.  In the ER work-up was done including CT abdomen pelvis that showed significant worsening of her masses.  Also evidence of pancreatitis and duodenitis.  She has markedly elevated lipase as well probably from the duodenitis and pancreatitis.  Not alcoholic.  Suspected this to be due to intra-abdominal tumor.  She is being admitted for treatment and supportive care..  ED Course: Temperature 96.7 blood pressure 154/69 pulse 82 respiratory rate of 18 oxygen sat 99% on room air.  White count is 9.0 hemoglobin 11.3 platelets 354.  Sodium 132 potassium 3.5 chloride 97 CO2 22 creatinine 1.60 calcium 8.5 glucose 168 and lactic acid 2.5.   Chest x-ray showed no acute findings.  She has scarring and atelectasis that is not new.  CT abdomen pelvis showed circumferential thickening of the second and third portion of the duodenum with periduodenal stranding and free fluid.  Also involving thickening of the pancreatic head indicating duodenitis secondary to peptic ulcer disease with reactive pancreatic features versus a group pancreatitis.  She has large 12.9 cm lesion at the right adnexa where she has known history of ovarian cancer.  Patient is in significant pain at this point with multiple findings including rectosigmoid junction concerning for malignant lesion.  She is being admitted for pain management and supportive care  Review of Systems: As per HPI otherwise 10 point review of systems negative.    Past Medical History:  Diagnosis Date   Anxiety 02/18/2015   Atherosclerotic heart disease of native coronary artery without angina pectoris 02/14/2015   Bladder cancer ([place]) 02/18/2015   Blood transfusion without reported diagnosis    Cataract    CHF (congestive heart failure) ([place])    Diabetes mellitus without complication ([place])    Essential hypertension 02/14/2015   Hyperlipidemia 02/14/2015   Myocardial infarction [facility])    Osteoporosis    Paroxysmal atrial fibrillation ([place]) 02/14/2015   Presence of aortocoronary bypass graft 02/14/2015   Presence of cardiac pacemaker 02/15/2015   SSS (sick sinus syndrome) ([place]) 12/01/2015   Stage 3 chronic kidney disease ([place]) 04/22/2016   Stroke ([place])    Thyroid disease    TIA (transient ischemic attack)     Past Surgical History:  Procedure Laterality  Date   BACK SURGERY     BLADDER SURGERY     CAROTID ENDARTERECTOMY     CORONARY ARTERY BYPASS GRAFT     EYE SURGERY     INSERT / REPLACE / REMOVE PACEMAKER     IR RETROGRADE PYELOGRAM     NEPHROURETERECTOMY     TRANSURETHRAL RESECTION OF BLADDER       reports that she has never smoked. She has never used  smokeless tobacco. She reports previous alcohol use. She reports that she does not use drugs.  Allergies  Allergen Reactions   Amoxicillin Nausea And Vomiting   Amoxicillin-Pot Clavulanate Other (See Comments)   Atorvastatin Other (See Comments)    Other reaction(s): Myalgias (intolerance) Bones ache Bones ache    Clavulanic Acid Nausea And Vomiting   Hydrocodone Nausea And Vomiting   Codeine Nausea Only   Latex Rash   Promethazine Nausea Only   Sulfa Antibiotics Nausea Only    Other reaction(s): Dizziness (intolerance)   Tape Rash   Tramadol Nausea And Vomiting and Nausea Only    Family History  Problem Relation Age of Onset   Diabetes Maternal Grandfather    Heart disease Maternal Grandfather    Huntington's disease Mother      Prior to Admission medications   Medication Sig Start Date End Date Taking? Authorizing Provider  albuterol (PROVENTIL HFA;VENTOLIN HFA) 108 (90 Base) MCG/ACT inhaler Inhale 2 puffs into the lungs every 4 (four) hours as needed. 03/30/16 02/09/19  [provider]  ALPRAZolam Duanne Moron) 0.5 MG tablet Take 1 tablet by mouth 3 (three) times daily as needed for anxiety.  12/18/17   [provider]  amiodarone (PACERONE) 100 MG tablet Take 0.5 tablets by mouth daily.     [provider]  amitriptyline (ELAVIL) 50 MG tablet Take 50 mg by mouth at bedtime. 12/02/17   [provider]  CARTIA XT 120 MG 24 hr capsule Take 120 mg by mouth daily. 01/04/18   [provider]  clopidogrel (PLAVIX) 75 MG tablet Take 0.5 tablets (37.5 mg total) by mouth daily. 05/27/18   Richardo Priest, MD  furosemide (LASIX) 40 MG tablet TAKE 1 TABLET BY MOUTH ONCE DAILY EXCEPT TAKE 2 TABLETS ON MONDAY,WEDNESDAY AND FRIDAY. 08/20/18   Richardo Priest, MD  gabapentin (NEURONTIN) 800 MG tablet Take 1 tablet by mouth 4 (four) times daily. 01/31/15   [provider]  levothyroxine (SYNTHROID, LEVOTHROID) 75 MCG tablet Take 1 tablet  by mouth daily. 12/17/17   [provider]  Multiple Vitamin (MULTI-VITAMINS) TABS Take 1 tablet by mouth daily.    [provider]  polyethylene glycol (MIRALAX / GLYCOLAX) packet Take 17 g by mouth daily.    [provider]  potassium chloride SA (K-DUR,KLOR-CON) 20 MEQ tablet Take 1 tablet by mouth daily. 06/07/17   [provider]  rosuvastatin (CRESTOR) 20 MG tablet Take 20 mg by mouth daily.    [provider]    Physical Exam: Vitals:   03/08/19 0145 03/08/19 0300 03/08/19 0315 03/08/19 0400  BP: (!) 155/61 (!) 126/52 (!) 132/55 139/72  Pulse: 77 79 82 82  Resp: 14 13 18 15   Temp:      TempSrc:      SpO2: 99% 94% 94% 95%  Weight:      Height:          Constitutional: NAD, chronically ill looking and weak Vitals:   03/08/19 0145 03/08/19 0300 03/08/19 0315 03/08/19 0400  BP: Marland Kitchen)  155/61 (!) 126/52 (!) 132/55 139/72  Pulse: 77 79 82 82  Resp: 14 13 18 15   Temp:      TempSrc:      SpO2: 99% 94% 94% 95%  Weight:      Height:       Eyes: PERRL, lids and conjunctivae jaundiced ENMT: Mucous membranes are dry. Posterior pharynx clear of any exudate or lesions.Normal dentition.  Neck: normal, supple, no masses, no thyromegaly Respiratory: clear to auscultation bilaterally, no wheezing, no crackles. Normal respiratory effort. No accessory muscle use.  Cardiovascular: Regular rate and rhythm, no murmurs / rubs / gallops. No extremity edema. 2+ pedal pulses. No carotid bruits.  Abdomen: n distended, diffuse tenderness especially epigastric region, visible abdominal wall hernia reducible, no palpable masses, bowel sounds positive.  Musculoskeletal: no clubbing / cyanosis. No joint deformity upper and lower extremities. Good ROM, no contractures. Normal muscle tone.  Skin: no rashes, lesions, ulcers. No induration Neurologic: CN 2-12 grossly intact. Sensation intact, DTR normal. Strength 5/5 in all 4.  Psychiatric: Normal judgment and  insight. Alert and oriented x 3. Normal mood.     Labs on Admission: I have personally reviewed following labs and imaging studies  CBC: Recent Labs  Lab 03/08/19 0036  WBC 9.0  NEUTROABS 5.9  HGB 11.3*  HCT 35.3*  MCV 89.4  PLT [id number]   Basic Metabolic Panel: Recent Labs  Lab 03/08/19 0036  NA 132*  K 3.5  CL 97*  CO2 22  GLUCOSE 168*  BUN 23  CREATININE 1.60*  CALCIUM 8.5*   GFR: Estimated Creatinine Clearance: 25 mL/min (A) (by C-G formula based on SCr of 1.6 mg/dL (H)). Liver Function Tests: Recent Labs  Lab 03/08/19 0036  AST 24  ALT 15  ALKPHOS 75  BILITOT 0.6  PROT 6.4*  ALBUMIN 3.5   Recent Labs  Lab 03/08/19 0036  LIPASE 2,181*   No results for input(s): AMMONIA in the last 168 hours. Coagulation Profile: No results for input(s): INR, PROTIME in the last 168 hours. Cardiac Enzymes: No results for input(s): CKTOTAL, CKMB, CKMBINDEX, TROPONINI in the last 168 hours. BNP (last 3 results) No results for input(s): PROBNP in the last 8760 hours. HbA1C: No results for input(s): HGBA1C in the last 72 hours. CBG: No results for input(s): GLUCAP in the last 168 hours. Lipid Profile: No results for input(s): CHOL, HDL, LDLCALC, TRIG, CHOLHDL, LDLDIRECT in the last 72 hours. Thyroid Function Tests: No results for input(s): TSH, T4TOTAL, FREET4, T3FREE, THYROIDAB in the last 72 hours. Anemia Panel: No results for input(s): VITAMINB12, FOLATE, FERRITIN, TIBC, IRON, RETICCTPCT in the last 72 hours. Urine analysis:    Component Value Date/Time   COLORURINE YELLOW 05/29/2007 [place] 05/29/2007 0955   LABSPEC 1.010 05/29/2007 0955   PHURINE 7.5 05/29/2007 0955   GLUCOSEU NEGATIVE 05/29/2007 0955   HGBUR NEGATIVE 05/29/2007 0955   BILIRUBINUR NEGATIVE 05/29/2007 0955   KETONESUR NEGATIVE 05/29/2007 0955   PROTEINUR NEGATIVE 05/29/2007 0955   UROBILINOGEN 0.2 05/29/2007 0955   NITRITE NEGATIVE 05/29/2007 0955   LEUKOCYTESUR   05/29/2007 0955    NEGATIVE MICROSCOPIC NOT DONE ON URINES WITH NEGATIVE PROTEIN, BLOOD, LEUKOCYTES, NITRITE, OR GLUCOSE <1000 mg/dL.   Sepsis Labs: @LABRCNTIP (procalcitonin:4,lacticidven:4) )No results found for this or any previous visit (from the past 240 hour(s)).   Radiological Exams on Admission: Ct Abdomen Pelvis Wo Contrast  Result Date: 03/08/2019 CLINICAL DATA:  Abdominal distension, syncope during bowel movements EXAM: CT ABDOMEN AND PELVIS  WITHOUT CONTRAST TECHNIQUE: Multidetector CT imaging of the abdomen and pelvis was performed following the standard protocol without IV contrast. COMPARISON:  CT 08/18/2018 (no report); CT 12/27/2017 FINDINGS: Lower chest: Postsurgical changes from prior CABG with partial visualization of the sternotomy wires. Multiple mediastinal surgical clips and extensive atherosclerotic calcification of the native vessels. Cardiac pacer/defibrillator leads terminate in right atrium and cardiac apex. No pericardial effusion. Lung bases demonstrate bandlike areas of scarring and/or atelectasis or more dependent ground-glass likely reflecting additional atelectatic change. Hepatobiliary: Multitude hypoattenuating lesions throughout the liver predominantly in the posterior right lobe with the largest lesion measuring 3.9 x 4.0 x 4.7 cm is present in the posterior right lobe liver (segment VI) with a small amount of adjacent intermediate attenuation (24 HU) subcapsular fluid. Additional hypoattenuating lesions are present in segment 4. Dependently layering hyperattenuating material is seen at the gallbladder neck. No calcified intraductal gallstones. No pericholecystic inflammation. Pancreas: There is a small amount of edematous thickening of the pancreatic head and inflammation within pancreaticoduodenal groove. No pancreatic ductal dilatation. Partial fatty replacement of the tail the pancreas. Extensive vascular calcifications are noted. Spleen: Extensive splenic vascular  calcification. No concerning splenic lesions. Adrenals/Urinary Tract: Normal adrenal glands. Postsurgical changes from prior right nephrectomy. No abnormal soft tissue in the nephrectomy bed. Left kidney appears slightly atrophic. No concerning left renal lesions. No urolithiasis or hydronephrosis. Slightly distorted contour of the urinary bladder is similar to comparison radiographs. No abnormal bladder wall thickening. Stomach/Bowel: Distal esophagus is unremarkable. The stomach is free of abnormality. There is circumferential thickening of the second and third portions of the duodenum with adjacent periduodenal stranding and free fluid, particularly within the pancreaticoduodenal groove. No extraluminal gas is identified. The appendix is not identified. No pericecal inflammation. Portion of the transverse colon is protruding into a ventral hernia sac with a 6.8 by 4.0 cm fascial defect. There is irregular circumferential mural thickening near the rectosigmoid junction. Additional circumferential rectal wall thickening is present as well with some adjacent stranding. Vascular/Lymphatic: Atherosclerotic plaque within the normal caliber aorta. Reactive adenopathy in the upper abdomen. Reproductive: Anteverted uterus. Large heterogeneously enhancing lesion appears to arise within the right adnexa, intimate with the terminus of the gonadal veins but also without discernible fat plane from the anterolateral uterus. Lesion is measuring up to 10.6 x 9.4 x 12.9 cm in size with heterogeneous internal attenuation and some coarse calcification. This is enlarged from comparison CT in February of 2020 and new since July of 2019. Additional cystic and calcified foci are present in the left adnexa as well. Other: Large bowel containing hernia anteriorly, as detailed above. Musculoskeletal: No acute osseous abnormality or suspicious osseous lesion. Multilevel degenerative changes are present in the imaged portions of the spine.  IMPRESSION: 1. Circumferential thickening of the second and third portions of the duodenum with adjacent periduodenal stranding and free fluid, with some fluid in the pancreaticoduodenal groove and mild edematous thickening of the pancreatic head. Finding could reflect a duodenitis secondary to peptic ulcer disease with reactive pancreatic features versus a groove pancreatitis. Correlation with lipase is recommended. 2. Large 12.9 cm heterogeneous lesion felt to most likely arise from the right adnexa but intimate with the lateral aspect of the uterine fundus as well. Markedly enlarged from recent comparison studies. Additional cystic and solid lesion is present in the left adnexa. Findings are concerning adnexal malignancy. Could further assess with dedicated pelvic ultrasound if clinically warranted. 3. Irregular mural thickening of the rectosigmoid junction, concerning for a malignant  lesion. Recommend correlation with direct visualization. More circumferential thickening and adjacent inflammation of the distal rectum could reflect a superimposed proctitis. 4. Heterogeneous but predominantly hypoattenuating lesion measuring 3.9 x 4.0 x 4.7 cm in the posterior right lobe liver (segment VI) with a small amount of adjacent intermediate attenuation (24 HU) subcapsular fluid. Findings are concerning for hepatic metastatic disease. 5. Large ventral hernia containing a portion of the transverse colon. No evidence of mechanical obstruction or vascular compromise. 6. Aortic Atherosclerosis (ICD10-I70.0). These results were called by telephone at the time of interpretation on 03/08/2019 at 2:57 am to provider [facility] , who verbally acknowledged these results. Electronically Signed   By: Lovena Le M.D.   On: 03/08/2019 02:57   Dg Chest Portable 1 View  Result Date: 03/08/2019 CLINICAL DATA:  Syncope. EXAM: PORTABLE CHEST 1 VIEW COMPARISON:  Radiograph 01/27/2019. chest CTA 01/05/2019 FINDINGS: Low lung volumes.  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 sternotomy and CABG. Blunting of the left costophrenic angle with associated peripheral opacities. Vascular congestion without definite edema. No pneumothorax. No acute osseous abnormalities are seen. Chronic deformity of the right proximal humerus. IMPRESSION: Left basilar opacities similar to prior exams, favor chronic atelectasis/scarring. Low lung volumes with vascular congestion. Electronically Signed   By: Keith Rake M.D.   On: 03/08/2019 01:35    EKG: Independently reviewed.  It shows sinus rhythm with a rate of 78, no significant ST changes  Assessment/Plan Principal Problem:   Acute pancreatitis Active Problems:   Anxiety   Coronary artery disease involving native coronary artery of native heart with angina pectoris (HCC)   Bladder cancer (HCC)   Hypertensive heart disease   Hyperlipidemia   Paroxysmal atrial fibrillation (HCC)   Stage 3 chronic kidney disease (HCC)   Acute duodenitis   Adnexal mass   Syncope     #1 acute pancreatitis: Appears to be groove pancreatitis probably secondary to tomorrow and intra-abdominal lesions.  Also surrounding duodenitis.  Patient will be admitted.  Strict bowel rest.  Pain management and nausea vomiting management.  Follow lipase level and monitor.  #2 acute duodenitis: As per above.  I will add empiric antibiotics.  IV fluids with D5 Ringer's lactate.  #3 right ovarian cancer: Appears to have some metastasis.  She has been seen by outpatient oncologist.  Defer care to them.  #4 syncope: Probably due to dehydration.  Aggressively hydrate and check PT and OT.  She has cardiac history but this appears to have no acute cardiac decompensation.  #5 coronary artery disease: Stable at baseline.  So far enzymes negative.  #6 hypertensive heart disease: Continue supportive care.  #7 chronic kidney disease stage III: Continue with home regimen.  #8 Hypothermia: Now temperature is 96.  Patient probably early sepsis. Transfer to SDU. Contiue abx. Blood Cx.   DVT prophylaxis: Heparin Code Status: Full code Family Communication: Daughter Disposition Plan: To be determined Consults called: None Admission status: Inpatient  Severity of Illness: The appropriate patient status for this patient is INPATIENT. Inpatient status is judged to be reasonable and necessary in order to provide the required intensity of service to ensure the patient's safety. The patient's presenting symptoms, physical exam findings, and initial radiographic and laboratory data in the context of their chronic comorbidities is felt to place them at high risk for further clinical deterioration. Furthermore, it is not anticipated that the patient will be medically stable for discharge from the hospital within 2 midnights of admission. The following factors support the  patient status of inpatient.   " The patient's presenting symptoms include syncope with abdominal pain. " The worrisome physical exam findings include chronically ill looking. " The initial radiographic and laboratory data are worrisome because of lipase of more than 12,000. " The chronic co-morbidities include ovarian cancer.   * I certify that at the point of admission it is my clinical judgment that the patient will require inpatient hospital care spanning beyond 2 midnights from the point of admission due to high intensity of service, high risk for further deterioration and high frequency of surveillance required.Barbette Merino MD [facility] Pager [phone]  If 7PM-7AM, please contact night-coverage [email or web address] Password [facility]  03/08/2019, 4:12 AM

## 2019-03-08 NOTE — ED Provider Notes (Signed)
Medical screening examination/treatment/procedure(s) were conducted as a shared visit with non-physician practitioner(s) and myself.  I personally evaluated the patient during the encounter.  Here with syncopal episode and subsequently with severe abdominal pain. On my exam, VS WNL.  Awake/alert Diffuse abdominal ttp without obvious peritonitis.   Plan for workup of abdominal pain and syncope, likely admit 2/2 age/cardiac hx and syncope.    EKG Interpretation  Date/Time:  Sunday March 08 2019 02:47:45 EDT Ventricular Rate:  78 PR Interval:    QRS Duration: 133 QT Interval:  466 QTC Calculation: 531 R Axis:   97 Text Interpretation:  Sinus rhythm Nonspecific intraventricular conduction delay Nonspecific T abnormalities, anterior leads No significant change since last tracing Confirmed by Merrily Pew [phone]) on 03/08/2019 6:28:14 AM        Krissy Orebaugh, Corene Cornea, MD 03/08/19 [id number]

## 2019-03-09 DIAGNOSIS — Z7189 Other specified counseling: Secondary | ICD-10-CM

## 2019-03-09 DIAGNOSIS — N183 Chronic kidney disease, stage 3 (moderate): Secondary | ICD-10-CM

## 2019-03-09 DIAGNOSIS — R55 Syncope and collapse: Secondary | ICD-10-CM

## 2019-03-09 DIAGNOSIS — Z66 Do not resuscitate: Secondary | ICD-10-CM

## 2019-03-09 LAB — COMPREHENSIVE METABOLIC PANEL
ALT: 11 U/L (ref 0–44)
AST: 19 U/L (ref 15–41)
Albumin: 2.6 g/dL — ABNORMAL LOW (ref 3.5–5.0)
Alkaline Phosphatase: 59 U/L (ref 38–126)
Anion gap: 12 (ref 5–15)
BUN: 24 mg/dL — ABNORMAL HIGH (ref 8–23)
CO2: 21 mmol/L — ABNORMAL LOW (ref 22–32)
Calcium: 6.8 mg/dL — ABNORMAL LOW (ref 8.9–10.3)
Chloride: 99 mmol/L (ref 98–111)
Creatinine, Ser: 1.71 mg/dL — ABNORMAL HIGH (ref 0.44–1.00)
GFR calc Af Amer: 32 mL/min — ABNORMAL LOW (ref >60)
GFR calc non Af Amer: 27 mL/min — ABNORMAL LOW (ref >60)
Glucose, Bld: 164 mg/dL — ABNORMAL HIGH (ref 70–99)
Potassium: 4.1 mmol/L (ref 3.5–5.1)
Sodium: 132 mmol/L — ABNORMAL LOW (ref 135–145)
Total Bilirubin: 0.6 mg/dL (ref 0.3–1.2)
Total Protein: 5.4 g/dL — ABNORMAL LOW (ref 6.5–8.1)

## 2019-03-09 LAB — CBC
HCT: 39.7 % (ref 36.0–46.0)
Hemoglobin: 12.7 g/dL (ref 12.0–15.0)
MCH: 28.7 pg (ref 26.0–34.0)
MCHC: 32 g/dL (ref 30.0–36.0)
MCV: 89.8 fL (ref 80.0–100.0)
Platelets: 389 10*3/uL (ref 150–400)
RBC: 4.42 MIL/uL (ref 3.87–5.11)
RDW: 16.4 % — ABNORMAL HIGH (ref 11.5–15.5)
WBC: 12.2 10*3/uL — ABNORMAL HIGH (ref 4.0–10.5)
nRBC: 0 % (ref 0.0–0.2)

## 2019-03-09 LAB — HEMOGLOBIN A1C
Hgb A1c MFr Bld: 7.1 % — ABNORMAL HIGH (ref 4.8–5.6)
Mean Plasma Glucose: 157 mg/dL

## 2019-03-09 LAB — GLUCOSE, CAPILLARY
Glucose-Capillary: 168 mg/dL — ABNORMAL HIGH (ref 70–99)
Glucose-Capillary: 194 mg/dL — ABNORMAL HIGH (ref 70–99)
Glucose-Capillary: 158 mg/dL — ABNORMAL HIGH (ref 70–99)
Glucose-Capillary: 179 mg/dL — ABNORMAL HIGH (ref 70–99)

## 2019-03-09 LAB — URINE CULTURE: Culture: <10000 — AB

## 2019-03-09 MED ORDER — POLYETHYLENE GLYCOL 3350 17 G PO PACK: 17.0000 g | PACK | Freq: Every day | ORAL | Status: DC

## 2019-03-09 MED ORDER — SENNOSIDES-DOCUSATE SODIUM 8.6-50 MG PO TABS
3.0000 | ORAL_TABLET | Freq: Two times a day (BID) | ORAL | Status: DC
  Administered 2019-03-10 – 2019-03-11 (×3): 3 via ORAL
  Filled 2019-03-09 (×3): qty 3

## 2019-03-09 MED ORDER — HYDROMORPHONE HCL 1 MG/ML IJ SOLN
0.2500 mg | INTRAMUSCULAR | Status: DC | PRN
  Administered 2019-03-09 – 2019-03-11 (×12): 0.25 mg via INTRAVENOUS
  Filled 2019-03-09 (×11): qty 1

## 2019-03-09 MED ORDER — BISACODYL 10 MG RE SUPP: 10.0000 mg | Freq: Every day | RECTAL | Status: DC | PRN

## 2019-03-09 NOTE — Progress Notes (Signed)
PROGRESS NOTE                                                                                                                                                                                                             Patient Demographics:    Cassandra Castro, is a 85 y.o. female, DOB - [birth year], [id number]  Admit date - 03/08/2019   Admitting Physician Elwyn Reach, MD  Outpatient Primary MD for the patient is Myrlene Broker, MD  LOS - 1   Chief Complaint  Patient presents with   Loss of Consciousness       Brief Narrative    85 y.o. 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  Episode happened while she was in the bathroom.  Will she reports significant abdominal pain, her work-up significant for groove pancreatitis, presents of liver mets, and the second right ovarian tumor size, was admitted for further management.   Subjective:    Cassandra Castro today still complaining of abdominal pain.   Assessment  & Plan :    Principal Problem:   Acute pancreatitis Active Problems:   Anxiety   Coronary artery disease involving native coronary artery of native heart with angina pectoris (HCC)   Bladder cancer (HCC)   Hypertensive heart disease   Hyperlipidemia   Paroxysmal atrial fibrillation (HCC)   Stage 3 chronic kidney disease (HCC)   Acute duodenitis   Adnexal mass   Syncope   Palliative care encounter    Acute pancreatitis:  - Appears to be groove pancreatitis. -Patient remains with significant abdominal pain, continue with PRN Dilaudid for now, had to decrease  her pain dose given she is sensitive to pain meds, but increased frequency to every 3 hours,. -Continue with IV fluids -Keep n.p.o.   Acute duodenitis:  -Most likely related to adjacent acute pancreatitis, continue with IV Cipro and Flagyl  Right ovarian cancer:  -Patient is currently followed at [facility] oncology, plan was for liver mass biopsy on 9/23, history of renal  cancer, unclear primary malignancy, ovarian versus recurrent renal. - Patient with significant abdominal pain, appears multifactorial, in the setting of pancreatitis, and her malignancy, very sensitive to narcotics, had to lower her Dilaudid dose, palliative medicine input greatly appreciated.  Syncope -Possibly vasovagal, versus dehydration  Coronary artery disease: Stable at baseline.  So far enzymes negative.  Hypertensive heart disease: Continue supportive care.  Chronic kidney disease stage III: Continue with home regimen.  Hypothermia: Resolved  Code Status : DNR  Family Communication  : Discussed with daughter via phone  Disposition Plan  : pending further work-up  Barriers For Discharge : remains NPO, on IV fluids and antibiotics  Consults  :  Palliative  Procedures  : None  DVT Prophylaxis  :  Hale heparin  Lab Results  Component Value Date   PLT 389 03/09/2019    Antibiotics  :    Anti-infectives (From admission, onward)   Start     Dose/Rate Route Frequency Ordered Stop   03/08/19 0900  ciprofloxacin (CIPRO) IVPB 400 mg    Note to Pharmacy: Cipro 400 mg IV q24h for CrCl < 30 mL/min   400 mg 200 mL/hr over 60 Minutes Intravenous Daily 03/08/19 0755     03/08/19 0800  metroNIDAZOLE (FLAGYL) IVPB 500 mg     500 mg 100 mL/hr over 60 Minutes Intravenous Every 8 hours 03/08/19 0755          Objective:   Vitals:   03/08/19 2000 03/08/19 2200 03/09/19 0000 03/09/19 0400  BP: (!) 115/56 (!) 102/52 (!) 104/49 (!) 101/51  Pulse: 95 96 95 98  Resp: 16 (!) 21 18 15   Temp:     98.3 F (36.8 C)  TempSrc:      SpO2: 94% 95% 95% 93%  Weight:      Height:        Wt Readings from Last 3 Encounters:  03/08/19 77.1 kg  06/27/18 81.6 kg  02/25/18 81.2 kg     Intake/Output Summary (Last 24 hours) at 03/09/2019 1409 Last data filed at 03/09/2019 0800 Gross per 24 hour  Intake 644.81 ml  Output 410 ml  Net 234.81 ml     Physical Exam  Awake Alert, Oriented X 2, more appropriate and coherent today, Symmetrical Chest wall movement, diminished entry at the bases RRR,No Gallops,Rubs or new Murmurs, No Parasternal Heave +ve B.Sounds, significant tenderness in epigastric area, right upper abdomen wall hernia, but it is reproducible No Cyanosis, Clubbing or edema, No new Rash or bruise       Data Review:    CBC Recent Labs  Lab 03/08/19 0036 03/08/19 0614 03/08/19 0835 03/09/19 0422  WBC 9.0 8.6 11.5* 12.2*  HGB 11.3* 11.5* 12.3 12.7  HCT 35.3* 37.0 37.9 39.7  PLT 354 381 378 389  MCV 89.4 91.1 88.3 89.8  MCH 28.6 28.3 28.7 28.7  MCHC 32.0 31.1 32.5 32.0  RDW 15.8* 15.9* 15.9* 16.4*  LYMPHSABS 2.3  --   --   --   MONOABS 0.6  --   --   --   EOSABS 0.1  --   --   --   BASOSABS 0.0  --   --   --     Chemistries  Recent Labs  Lab 03/08/19 0036 03/08/19 0614 03/09/19 0422  NA 132* 132* 132*  K 3.5 4.0 4.1  CL 97* 98 99  CO2 22 21* 21*  GLUCOSE 168* 167* 164*  BUN 23 23 24*  CREATININE 1.60* 1.46*  1.71*  CALCIUM 8.5* 7.9* 6.8*  AST 24 23 19   ALT 15 13 11   ALKPHOS 75 69 59  BILITOT 0.6 0.4 0.6   ------------------------------------------------------------------------------------------------------------------ No results for input(s): CHOL, HDL, LDLCALC, TRIG, CHOLHDL, LDLDIRECT in the last 72 hours.  Lab Results  Component Value Date   HGBA1C 7.1 (H) 03/08/2019   ------------------------------------------------------------------------------------------------------------------ No results for input(s): TSH, T4TOTAL, T3FREE, THYROIDAB  in the last 72 hours.  Invalid input(s): FREET3 ------------------------------------------------------------------------------------------------------------------ No results for input(s): VITAMINB12, FOLATE, FERRITIN, TIBC, IRON, RETICCTPCT in the last 72 hours.  Coagulation profile No results for input(s): INR, PROTIME in the last 168 hours.  No results for input(s): DDIMER in the last 72 hours.  Cardiac Enzymes No results for input(s): CKMB, TROPONINI, MYOGLOBIN in the last 168 hours.  Invalid input(s): CK ------------------------------------------------------------------------------------------------------------------ No results found for: BNP  Inpatient Medications  Scheduled Meds:  amiodarone  50 mg Oral Daily   heparin  5,000 Units Subcutaneous Q8H   insulin aspart  0-9 Units Subcutaneous TID WC   levothyroxine  75 mcg Oral Daily   polyethylene glycol  17 g Oral Daily   senna-docusate  3 tablet Oral BID   Continuous Infusions:  ciprofloxacin 400 mg (03/09/19 0958)   dextrose 5% lactated ringers 75 mL/hr at 03/08/19 2054   metronidazole 500 mg (03/09/19 1235)   PRN Meds:.ALPRAZolam, bisacodyl, HYDROmorphone (DILAUDID) injection, ondansetron **OR** ondansetron (ZOFRAN) IV  Micro Results Recent Results (from the past 240 hour(s))  SARS Coronavirus 2 [facility] order, Performed in [facility] hospital lab) Nasopharyngeal Nasopharyngeal Swab     Status: None   Collection Time: 03/08/19  2:36 AM   Specimen: Nasopharyngeal Swab  Result Value Ref Range Status   SARS Coronavirus 2 NEGATIVE NEGATIVE Final    Comment: (NOTE) If result is NEGATIVE SARS-CoV-2 target nucleic acids are NOT DETECTED. The SARS-CoV-2 RNA is generally detectable in upper and lower  respiratory specimens during the acute phase of infection. The lowest  concentration of SARS-CoV-2 viral copies this assay can detect is 250  copies / mL. A negative result does not preclude SARS-CoV-2 infection    and should not be used as the sole basis for treatment or other  patient management decisions.  A negative result may occur with  improper specimen collection / handling, submission of specimen other  than nasopharyngeal swab, presence of viral mutation(s) within the  areas targeted by this assay, and inadequate number of viral copies  (<250 copies / mL). A negative result must be combined with clinical  observations, patient history, and epidemiological information. If result is POSITIVE SARS-CoV-2 target nucleic acids are DETECTED. The SARS-CoV-2 RNA is generally detectable in upper and lower  respiratory specimens dur ing the acute phase of infection.  Positive  results are indicative of active infection with SARS-CoV-2.  Clinical  correlation with patient history and other diagnostic information is  necessary to determine patient infection status.  Positive results do  not rule out bacterial infection or co-infection with other viruses. If result is PRESUMPTIVE POSTIVE SARS-CoV-2 nucleic acids MAY BE PRESENT.   A presumptive positive result was obtained on the submitted specimen  and confirmed on repeat testing.  While 2019 novel coronavirus  (SARS-CoV-2) nucleic acids may be present in the submitted sample  additional confirmatory testing may be necessary for epidemiological  and / or clinical management purposes  to differentiate between  SARS-CoV-2 and other Sarbecovirus currently known to infect humans.  If clinically indicated additional testing with an alternate test  methodology [phone]) is advised. The SARS-CoV-2 RNA is generally  detectable in upper and lower respiratory sp ecimens during the acute  phase of infection. The expected result is Negative. Fact Sheet for Patients:  StrictlyIdeas.no Fact Sheet for Healthcare Providers: BankingDealers.co.za This test is not yet approved or cleared by the [place] FDA  and has been authorized for detection and/or diagnosis of SARS-CoV-2 by FDA under an Emergency Use Authorization (EUA).  This EUA will remain in effect (meaning this test can be used) for the duration of the COVID-19 declaration under Section 564(b)(1) of the Act, 21 U.S.C. section 360bbb-3(b)(1), unless the authorization is terminated or revoked sooner. Performed at [facility], [place] [address] [place], [place] [address]   Urine culture     Status: Abnormal   Collection Time: 03/08/19  4:47 AM   Specimen: Urine, Random  Result Value Ref Range Status   Specimen Description URINE, RANDOM  Final   Special Requests NONE  Final   Culture (A)  Final    <10,000 COLONIES/mL INSIGNIFICANT GROWTH Performed at [facility], [place] [address], [place], [place] [address]    Report Status 03/09/2019 FINAL  Final  Culture, blood (Routine X 2) w Reflex to ID Panel     Status: None (Preliminary result)   Collection Time: 03/08/19  6:10 AM   Specimen: BLOOD RIGHT HAND  Result Value Ref Range Status   Specimen Description BLOOD RIGHT HAND  Final   Special Requests Blood Culture adequate volume  Final   Culture   Final    NO GROWTH 1 DAY Performed at [facility], [place] [address] [place], [place] [address]    Report Status PENDING  Incomplete  Culture, blood (Routine X 2) w Reflex to ID Panel     Status: None (Preliminary result)   Collection Time: 03/08/19  6:35 AM   Specimen: BLOOD LEFT HAND  Result Value Ref Range Status   Specimen Description BLOOD LEFT HAND  Final   Special Requests   Final    BOTTLES DRAWN AEROBIC AND ANAEROBIC Blood Culture results may not be optimal due to an inadequate volume of blood received in culture bottles   Culture   Final    NO GROWTH 1 DAY Performed at [facility], [place] [address], [place],  [address]    Report Status PENDING  Incomplete    Radiology Reports Ct Abdomen Pelvis Wo Contrast  Result Date: 03/08/2019 CLINICAL  DATA:  Abdominal distension, syncope during bowel movements EXAM: CT ABDOMEN AND PELVIS WITHOUT CONTRAST TECHNIQUE: Multidetector CT imaging of the abdomen and pelvis was performed following the standard protocol without IV contrast. COMPARISON:  CT 08/18/2018 (no report); CT 12/27/2017 FINDINGS: Lower chest: Postsurgical changes from prior CABG with partial visualization of the sternotomy wires. Multiple mediastinal surgical clips and extensive atherosclerotic calcification of the native vessels. Cardiac pacer/defibrillator leads terminate in right atrium and cardiac apex. No pericardial effusion. Lung bases demonstrate bandlike areas of scarring and/or atelectasis or more dependent ground-glass likely reflecting additional atelectatic change. Hepatobiliary: Multitude hypoattenuating lesions throughout the liver predominantly in the posterior right lobe with the largest lesion measuring 3.9 x 4.0 x 4.7 cm is present in the posterior right lobe liver (segment VI) with a small amount of adjacent intermediate attenuation (24 HU) subcapsular fluid. Additional hypoattenuating lesions are present in segment 4. Dependently layering hyperattenuating material is seen at the gallbladder neck. No calcified intraductal gallstones. No pericholecystic inflammation. Pancreas: There is  a small amount of edematous thickening of the pancreatic head and inflammation within pancreaticoduodenal groove. No pancreatic ductal dilatation. Partial fatty replacement of the tail the pancreas. Extensive vascular calcifications are noted. Spleen: Extensive splenic vascular calcification. No concerning splenic lesions. Adrenals/Urinary Tract: Normal adrenal glands. Postsurgical changes from prior right nephrectomy. No abnormal soft tissue in the nephrectomy bed. Left kidney appears slightly atrophic. No concerning left renal lesions. No urolithiasis or hydronephrosis. Slightly distorted contour of the urinary bladder is similar to comparison  radiographs. No abnormal bladder wall thickening. Stomach/Bowel: Distal esophagus is unremarkable. The stomach is free of abnormality. There is circumferential thickening of the second and third portions of the duodenum with adjacent periduodenal stranding and free fluid, particularly within the pancreaticoduodenal groove. No extraluminal gas is identified. The appendix is not identified. No pericecal inflammation. Portion of the transverse colon is protruding into a ventral hernia sac with a 6.8 by 4.0 cm fascial defect. There is irregular circumferential mural thickening near the rectosigmoid junction. Additional circumferential rectal wall thickening is present as well with some adjacent stranding. Vascular/Lymphatic: Atherosclerotic plaque within the normal caliber aorta. Reactive adenopathy in the upper abdomen. Reproductive: Anteverted uterus. Large heterogeneously enhancing lesion appears to arise within the right adnexa, intimate with the terminus of the gonadal veins but also without discernible fat plane from the anterolateral uterus. Lesion is measuring up to 10.6 x 9.4 x 12.9 cm in size with heterogeneous internal attenuation and some coarse calcification. This is enlarged from comparison CT in February of 2020 and new since July of 2019. Additional cystic and calcified foci are present in the left adnexa as well. Other: Large bowel containing hernia anteriorly, as detailed above. Musculoskeletal: No acute osseous abnormality or suspicious osseous lesion. Multilevel degenerative changes are present in the imaged portions of the spine. IMPRESSION: 1. Circumferential thickening of the second and third portions of the duodenum with adjacent periduodenal stranding and free fluid, with some fluid in the pancreaticoduodenal groove and mild edematous thickening of the pancreatic head. Finding could reflect a duodenitis secondary to peptic ulcer disease with reactive pancreatic features versus a groove  pancreatitis. Correlation with lipase is recommended. 2. Large 12.9 cm heterogeneous lesion felt to most likely arise from the right adnexa but intimate with the lateral aspect of the uterine fundus as well. Markedly enlarged from recent comparison studies. Additional cystic and solid lesion is present in the left adnexa. Findings are concerning adnexal malignancy. Could further assess with dedicated pelvic ultrasound if clinically warranted. 3. Irregular mural thickening of the rectosigmoid junction, concerning for a malignant lesion. Recommend correlation with direct visualization. More circumferential thickening and adjacent inflammation of the distal rectum could reflect a superimposed proctitis. 4. Heterogeneous but predominantly hypoattenuating lesion measuring 3.9 x 4.0 x 4.7 cm in the posterior right lobe liver (segment VI) with a small amount of adjacent intermediate attenuation (24 HU) subcapsular fluid. Findings are concerning for hepatic metastatic disease. 5. Large ventral hernia containing a portion of the transverse colon. No evidence of mechanical obstruction or vascular compromise. 6. Aortic Atherosclerosis (ICD10-I70.0). These results were called by telephone at the time of interpretation on 03/08/2019 at 2:57 am to provider [facility] , who verbally acknowledged these results. Electronically Signed   By: Lovena Le M.D.   On: 03/08/2019 02:57   Dg Chest Portable 1 View  Result Date: 03/08/2019 CLINICAL DATA:  Syncope. EXAM: PORTABLE CHEST 1 VIEW COMPARISON:  Radiograph 01/27/2019. chest CTA 01/05/2019 FINDINGS: Low lung volumes. Left-sided pacemaker remains in place.  Post median sternotomy and CABG. Blunting of the left costophrenic angle with associated peripheral opacities. Vascular congestion without definite edema. No pneumothorax. No acute osseous abnormalities are seen. Chronic deformity of the right proximal humerus. IMPRESSION: Left basilar opacities similar to prior exams, favor  chronic atelectasis/scarring. Low lung volumes with vascular congestion. Electronically Signed   By: Keith Rake M.D.   On: 03/08/2019 01:35     Phillips Climes M.D on 03/09/2019 at 2:09 PM  Between 7am to 7pm - Pager - [phone]  After 7pm go to [email or web address] - password [facility]  [facility] -  Office  [phone]

## 2019-03-09 NOTE — Consult Note (Signed)
   [facility] CM Inpatient Consult   03/09/2019  Cassandra Castro 08/07/1933 [id number]   Patient screened for 17% risk score of unplanned readmission and hospitalization with her [employer] plan.  Patient had been previously engaged by [facility] care management coordinators for HF,community resources,  History and physical dated 9/13 reveals as:   [name] is an 84 y.o. 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 She is admitted for treatment and supportive care.  Noted patient is currently listed as an Engaged [facility] patient. She will be followed by [facility] in the community, with full case management services.    [facility] care management services are not appropriate at this time.    Will sign off.    For questions, please contact:  Edwena Felty A. Rami Budhu, BSN, RN-BC [facility] Liaison Cell: [phone]

## 2019-03-09 NOTE — Progress Notes (Signed)
Daily Progress Note   Patient Name: Cassandra Castro       Date: 03/09/2019 DOB: Nov 17, 1933  Age: 85 y.o. MRN#: [id number] Attending Physician: Albertine Patricia, MD Primary Care Physician: Myrlene Broker, MD Admit Date: 03/08/2019  Reason for Consultation/Follow-up: Establishing goals of care, Non pain symptom management, Pain control and Psychosocial/spiritual support  Subjective: Patient in bed moaning and complaining of abdominal pain. She recently received PRN medication however, she expresses "it is not enough, I am still hurting bad!". She required Narcan on yesterday after receiving several doses of medication. Right upper abdominal hernia noted. No family is at the bedside.   I spoke with patient's husband and daughter, [name] [facility]) over the phone. We discussed patient's current illness, prognosis, and pain. Patient is awake, alert & oriented. She is also engaged in discussion. They were able to summarize their [place] discussion with my colleague Merrily Pew, NP on yesterday. Patient now expressing she is not interested in further work-up or biopsy. She states "I am in pain and miserable. I just want to go home and be comfortable. When can hospice come?"   I re-educated patient and family on goals and philosophy of hospice Family and patient aware that all forms or aggressive treatment would be discontinued and hospice would support patient and family in the home. Care would focus on patient's comfort and symptom management. Family requesting to further discuss and daughter, [name] requesting to call back later this afternoon or to meet at the bedside.   RN at the bedside. PRN medications to be administered for comfort at this time.   1601: Daughter [name], called and stated she was at the bedside and  requesting to meet. Patient asleep but easily aroused to engage. Observed moaning and complaining of pain in abdominal area. Stating "it is my ovaries that hurting, please don't let me keep hurting!" Support given. Continued discussion with daughter, [name], husband and granddaughter via phone. Family is requesting patient returns home with outpatient hospice support with awareness if symptoms are unmanageable she could potentially transfer to a local residential hospice facility for care by discussing with the outpatient hospice team. Patient and family all verbalized their wishes are for her to hopefully remain in the home and be comfortable. Patient expressing her love to family and stating "I don't want to suffer no  more!"   Daughter reports they currently have a hospital bed, bedside commode, and tray table. They are requesting oxygen for support. Daughter states they reside in [place] and would like hospice in that area. Support given. I educated family on referral process. Family states they are hopeful patient will get to come home in the next few days.   We discussed comfort measures while hospitalized however family is fearful patient would pass away here in the hospital. They are requesting to continue current treatment plan. Family requesting labs for tomorrow and then ok with discontinuing. Patient asking for something to drink. Educated patient and family on pancreatitis and possibility of worsening pain. Will give patient 1-2 ice chips for comfort while at the bedside.   All questions answered to best of my ability.   Length of Stay: 1  Current Medications: Scheduled Meds:   amiodarone  50 mg Oral Daily   heparin  5,000 Units Subcutaneous Q8H   insulin aspart  0-9 Units Subcutaneous TID WC   levothyroxine  75 mcg Oral Daily    Continuous Infusions:  ciprofloxacin 400 mg (03/09/19 0958)   dextrose 5% lactated ringers 75 mL/hr at 03/08/19 2054   metronidazole 500 mg  (03/09/19 0520)    PRN Meds: ALPRAZolam, HYDROmorphone (DILAUDID) injection, ondansetron **OR** ondansetron (ZOFRAN) IV  Physical Exam   -awake, alert, oriented x3 (able to appropriately answer all questions), chronically-ill appearing -diminished bilaterally -RRR -tenderness, right upper hernia, +bs x4 -moaning and grimacing at times         Vital Signs: BP (!) 101/51    Pulse 98    Temp 98.3 F (36.8 C)    Resp 15    Ht 5\' 1"  (1.549 m)    Wt 77.1 kg    SpO2 93%    BMI 32.12 kg/m  SpO2: SpO2: 93 % O2 Device: O2 Device: Nasal Cannula O2 Flow Rate: O2 Flow Rate (L/min): 2 L/min  Intake/output summary:   Intake/Output Summary (Last 24 hours) at 03/09/2019 1227 Last data filed at 03/09/2019 0800 Gross per 24 hour  Intake 644.81 ml  Output 410 ml  Net 234.81 ml   LBM:   Baseline Weight: Weight: 77.1 kg Most recent weight: Weight: 77.1 kg       Palliative Assessment/Data:PPS 20%       Patient Active Problem List   Diagnosis Date Noted   Acute duodenitis 03/08/2019   Adnexal mass 03/08/2019   Acute pancreatitis 03/08/2019   Syncope 03/08/2019   Palliative care encounter    On amiodarone therapy 02/25/2018   Acute renal failure (HCC) 01/10/2018   Chronic anxiety [id number]   Metabolic encephalopathy Q000111Q   Obesity with body mass index 30 or greater 12/27/2017   Weakness of both lower extremities 09/27/2017   Stage 3 chronic kidney disease ([place]) 04/22/2016   SSS (sick sinus syndrome) (HCC) 12/01/2015   Anxiety 02/18/2015   Bladder cancer ([place]) 02/18/2015   Presence of cardiac pacemaker 02/15/2015   Coronary artery disease involving native coronary artery of native heart with angina pectoris ([place]) 02/14/2015   Hypertensive heart disease 02/14/2015   Hyperlipidemia 02/14/2015   Paroxysmal atrial fibrillation ([place]) 02/14/2015   Presence of aortocoronary bypass graft 02/14/2015   Pure hypercholesterolemia 02/14/2015    Palliative Care  Assessment & Plan   Recommendations/Plan:  DNR-as confirmed by patient/[name] (daughter)  Continue current treatment plan per attending  Patient/Family requesting outpatient hospice (will need home oxygen). Family reports they already have a hospital bed, BSC, and tray.  Hopeful to come home in the next day or so.   Outpatient hospice in the home  Will continue with Dilaudid PRN for pain, Xanax for anxiety, no changes  PMT will continue to support and follow  Goals of Care and Additional Recommendations:  Limitations on Scope of Treatment: Full Scope Treatment, No Artificial Feeding, No Diagnostics and DNR.   Code Status:    Code Status Orders  (From admission, onward)         Start     Ordered   03/08/19 0755  Do not attempt resuscitation (DNR)  Continuous    Question Answer Comment  In the event of cardiac or respiratory ARREST Do not call a code blue   In the event of cardiac or respiratory ARREST Do not perform Intubation, CPR, defibrillation or ACLS   In the event of cardiac or respiratory ARREST Use medication by any route, position, wound care, and other measures to relive pain and suffering. May use oxygen, suction and manual treatment of airway obstruction as needed for comfort.      03/08/19 0755        Code Status History    This patient has a current code status but no historical code status.   Advance Care Planning Activity      Prognosis:   < 6 weeks  Discharge Planning:  Home with Hospice  Care plan was discussed with patient, family, and RN.   Thank you for allowing the Palliative Medicine Team to assist in the care of this patient.  Total Time: 65 min.   Greater than 50%  of this time was spent counseling and coordinating care related to the above assessment and plan  Please contact Palliative Medicine Team phone at [phone] for questions and concerns.

## 2019-03-10 ENCOUNTER — Encounter (HOSPITAL_COMMUNITY): Payer: Self-pay | Admitting: General Practice

## 2019-03-10 DIAGNOSIS — K858 Other acute pancreatitis without necrosis or infection: Principal | ICD-10-CM

## 2019-03-10 DIAGNOSIS — I25119 Atherosclerotic heart disease of native coronary artery with unspecified angina pectoris: Secondary | ICD-10-CM

## 2019-03-10 LAB — GLUCOSE, CAPILLARY
Glucose-Capillary: 157 mg/dL — ABNORMAL HIGH (ref 70–99)
Glucose-Capillary: 185 mg/dL — ABNORMAL HIGH (ref 70–99)
Glucose-Capillary: 155 mg/dL — ABNORMAL HIGH (ref 70–99)
Glucose-Capillary: 178 mg/dL — ABNORMAL HIGH (ref 70–99)

## 2019-03-10 LAB — CBC
HCT: 35.2 % — ABNORMAL LOW (ref 36.0–46.0)
Hemoglobin: 11.6 g/dL — ABNORMAL LOW (ref 12.0–15.0)
MCH: 29.1 pg (ref 26.0–34.0)
MCHC: 33 g/dL (ref 30.0–36.0)
MCV: 88.4 fL (ref 80.0–100.0)
Platelets: 316 10*3/uL (ref 150–400)
RBC: 3.98 MIL/uL (ref 3.87–5.11)
RDW: 16.4 % — ABNORMAL HIGH (ref 11.5–15.5)
WBC: 14.7 10*3/uL — ABNORMAL HIGH (ref 4.0–10.5)
nRBC: 0 % (ref 0.0–0.2)

## 2019-03-10 LAB — BASIC METABOLIC PANEL
Anion gap: 12 (ref 5–15)
BUN: 30 mg/dL — ABNORMAL HIGH (ref 8–23)
CO2: 20 mmol/L — ABNORMAL LOW (ref 22–32)
Calcium: 6.4 mg/dL — CL (ref 8.9–10.3)
Chloride: 100 mmol/L (ref 98–111)
Creatinine, Ser: 1.74 mg/dL — ABNORMAL HIGH (ref 0.44–1.00)
GFR calc Af Amer: 31 mL/min — ABNORMAL LOW (ref >60)
GFR calc non Af Amer: 27 mL/min — ABNORMAL LOW (ref >60)
Glucose, Bld: 197 mg/dL — ABNORMAL HIGH (ref 70–99)
Potassium: 3.6 mmol/L (ref 3.5–5.1)
Sodium: 132 mmol/L — ABNORMAL LOW (ref 135–145)

## 2019-03-10 LAB — LIPASE, BLOOD: Lipase: 125 U/L — ABNORMAL HIGH (ref 11–51)

## 2019-03-10 MED ORDER — MORPHINE SULFATE (CONCENTRATE) 10 MG/0.5ML PO SOLN
2.5000 mg | ORAL | Status: DC | PRN
  Administered 2019-03-11 (×2): 2.6 mg via ORAL
  Filled 2019-03-10 (×2): qty 0.5

## 2019-03-10 MED ORDER — CALCIUM GLUCONATE-NACL 2-0.675 GM/100ML-% IV SOLN
2.0000 g | Freq: Once | INTRAVENOUS | Status: DC
  Filled 2019-03-10: qty 100

## 2019-03-10 MED ORDER — LORAZEPAM 2 MG/ML PO CONC: 0.5000 mg | Freq: Four times a day (QID) | ORAL | Status: DC | PRN

## 2019-03-10 MED ORDER — SODIUM CHLORIDE 0.9 % IV SOLN
500.0000 mg | Freq: Two times a day (BID) | INTRAVENOUS | Status: DC
  Administered 2019-03-10: 21:00:00 500 mg via INTRAVENOUS
  Filled 2019-03-10 (×4): qty 0.5

## 2019-03-10 MED ORDER — MAGNESIUM CITRATE PO SOLN: 1.0000 | Freq: Once | ORAL | Status: DC

## 2019-03-10 NOTE — Progress Notes (Signed)
Notified by Charleston Ropes with [facility] that pt has been approved for home hospice services. They are ordering home DME for delivery today to the home. Pt will be ready for transport home via [facility] once equipment has been verified as delivered. Pt will need GOLD DNR for transport- and d/c summary faxed to [facility] at [phone]. They will plan on doing intake RN visit tomorrow if pt can be discharged early in the day.

## 2019-03-10 NOTE — TOC Initial Note (Signed)
Transition of Care (TOC) - Initial/Assessment Note  Marvetta Gibbons RN, BSN Transitions of Care Unit 4E- RN Case Manager [phone]   Patient Details  Name: Cassandra Castro MRN: [id number] Date of Birth: [birth year]  Transition of Care [facility]) CM/SW Contact:    [name], RN Phone Number: 03/10/2019, 10:18 AM  Clinical Narrative:                 Referral received for home hospice needs, call made to daughter [name] to offer choice for home hospice agency- list read to daughter over TC- per daughter they would [facility] to use [facility]- reviewed possible DME needs- pt already has hospital bed, BSC, bed table, RW- will need home 02 set up. Daughter also requesting ambulance transport home, will set up PT[facility] transport once hospice arrangements in place and DME has been delivered to home. Pt will need GOLD DNR for transport. Address confirmed with daughter in epic. Call made to [facility] for home hospice referral- spoke with daisy- faxed info to [phone] via epic. Will await return call from [facility] to confirm Hospice services/eligibility.   Expected Discharge Plan: Home w Hospice Care Barriers to Discharge: No Barriers Identified   Patient Goals and CMS Choice Patient states their goals for this hospitalization and ongoing recovery are:: "to be home and comfortable and be with family"(per daughter [name]) CMS Medicare.gov Compare Post Acute Care list provided to:: Patient Represenative (must comment)(Daughter- [name]) Choice offered to / list presented to : Adult Children  Expected Discharge Plan and Services Expected Discharge Plan: [place]   Discharge Planning Services: CM Consult Post Acute Care Choice: Hospice Living arrangements for the past 2 months: Single Family Home                 DME Arranged: Oxygen DME Agency: [facility]       HH Arranged: Disease Management [place] Agency: [facility] Date [place]: 03/10/19 Time Warden: 19 Representative spoke with at [place]: [place] Arrangements/Services Living arrangements for the past 2 months: [place] with:: Adult Children, Spouse Patient language and need for interpreter reviewed:: Yes Do you feel safe going back to the place where you live?: Yes      Need for Family Participation in Patient Care: Yes (Comment)   Current home services: DME Criminal Activity/Legal Involvement Pertinent to Current Situation/Hospitalization: No - Comment as needed  Activities of Daily Living Home Assistive Devices/Equipment: Gilford Rile (specify type) ADL Screening (condition at time of admission) Patient's cognitive ability adequate to safely complete daily activities?: No Is the patient deaf or have difficulty hearing?: Yes Does the patient have difficulty seeing, even when wearing glasses/contacts?: No Does the patient have difficulty concentrating, remembering, or making decisions?: Yes Patient able to express need for assistance with ADLs?: Yes Does the patient have difficulty dressing or bathing?: No Independently performs ADLs?: Yes (appropriate for developmental age) Does the patient have difficulty walking or climbing stairs?: Yes Weakness of Legs: Both Weakness of Arms/Hands: None  Permission Sought/Granted Permission sought to share information with : Facility Theatre stage manager Information with NAME: [name]  Permission granted to share info w AGENCY: Hospice  Permission granted to share info w Relationship: daughter  Permission granted to share info w Contact Information: [phone]  Emotional Assessment Appearance:: Appears stated age Attitude/Demeanor/Rapport: Engaged Affect (typically observed): Appropriate Orientation: : Oriented to Self, Oriented to  Place, Oriented to  Time, Oriented to Situation   Psych Involvement: No (comment)  Admission diagnosis:  Syncope and  collapse [R55] Adnexal mass [N94.89] Other acute pancreatitis without infection or necrosis [K85.80] Acute pancreatitis [K85.90] Patient Active Problem List   Diagnosis Date Noted  . Acute duodenitis 03/08/2019  . Adnexal mass 03/08/2019  . Acute pancreatitis 03/08/2019  . Syncope 03/08/2019  . Palliative care encounter   . On amiodarone therapy 02/25/2018  . Acute renal failure ([place]) 01/10/2018  . Chronic anxiety 01/10/2018  . Metabolic encephalopathy Q000111Q  . Obesity with body mass index 30 or greater 12/27/2017  . Weakness of both lower extremities 09/27/2017  . Stage 3 chronic kidney disease ([place]) 04/22/2016  . SSS (sick sinus syndrome) ([place]) 12/01/2015  . Anxiety 02/18/2015  . Bladder cancer ([place]) 02/18/2015  . Presence of cardiac pacemaker 02/15/2015  . Coronary artery disease involving native coronary artery of native heart with angina pectoris ([place]) 02/14/2015  . Hypertensive heart disease 02/14/2015  . Hyperlipidemia 02/14/2015  . Paroxysmal atrial fibrillation ([place]) 02/14/2015  . Presence of aortocoronary bypass graft 02/14/2015  . Pure hypercholesterolemia 02/14/2015   PCP:  Myrlene Broker, MD Pharmacy:   [facility] [address] [place] [address] Phone: [phone] Fax: [phone]     Social Determinants of Health (SDOH) Interventions    Readmission Risk Interventions No flowsheet data found.

## 2019-03-10 NOTE — Progress Notes (Addendum)
Daily Progress Note   Patient Name: Cassandra Castro       Date: 03/10/2019 DOB: 1933-07-09  Age: 85 y.o. MRN#: [id number] Attending Physician: Albertine Patricia, MD Primary Care Physician: Myrlene Broker, MD Admit Date: 03/08/2019  Reason for Consultation/Follow-up: Establishing goals of care, Non pain symptom management, Pain control and Psychosocial/spiritual support  Subjective: Patient in bed resting comfortably easily awaken with verbal stimuli. Appears much more comfortable today compared to yesterday as she was frequently moaning and tearful in pain. She does continue to endorse pain, but with some improvement with medication.  She occasionally asking for something to drink. Again discussed risk of large amounts of liquids, however provided with a few ice chips for comfort. No family at the bedside.   I called and spoke with daughter, [name] via phone. She verbalized appreciation of goals of care discussion with her and other family members yesterday. She shares they continued their discussion yesterday afternoon and all are making adjustments to care for patient with support of hospice. She again confirms wishes for Mrs. Blankley to come home and be with family for EOL care. She reports she has spoken to [place] and hospice RN and they are preparing to make arrangements for home needs. Daughter asking when patient will be discharged home as family is anxiously awaiting to love on her. Educated daughter on process of approval and arrangements with hospice. She understands patient may not be discharged home until tomorrow.   We discussed care given while she remained hospitalized. Daughter is requesting to continue with current treatment plan (antibiotics) with hopes of some improvement in pain and  inflammation. She verbalized wishes to no longer draw lab work, tele monitoring, or additions to further aggressive care. She does wish to also continue CBG monitoring until she is discharged home as well as making sure her mother is not in pain.   All questions answered to best of my ability.   Length of Stay: 2  Current Medications: Scheduled Meds:  . amiodarone  50 mg Oral Daily  . heparin  5,000 Units Subcutaneous Q8H  . insulin aspart  0-9 Units Subcutaneous TID WC  . levothyroxine  75 mcg Oral Daily  . polyethylene glycol  17 g Oral Daily  . senna-docusate  3 tablet Oral BID    Continuous Infusions: .  ciprofloxacin 400 mg (03/10/19 0944)  . dextrose 5% lactated ringers 75 mL/hr at 03/08/19 2054  . metronidazole 500 mg (03/10/19 0330)    PRN Meds: ALPRAZolam, bisacodyl, HYDROmorphone (DILAUDID) injection, ondansetron **OR** ondansetron (ZOFRAN) IV  Physical Exam   -awake, alert, oriented, chronically-ill appearing, continues to complain of generalized abdominal pain -diminished bilaterally -RRR -tenderness, right upper hernia, +bs x4 Occasional moaning, more with movement          Vital Signs: BP (!) 141/63 (BP Location: Left Wrist)   Pulse 96   Temp 98.5 F (36.9 C) (Oral)   Resp 20   Ht 5\' 1"  (1.549 m)   Wt 77.1 kg   SpO2 97%   BMI 32.12 kg/m  SpO2: SpO2: 97 % O2 Device: O2 Device: Room Air O2 Flow Rate: O2 Flow Rate (L/min): 2 L/min  Intake/output summary:   Intake/Output Summary (Last 24 hours) at 03/10/2019 1106 Last data filed at 03/09/2019 1832 Gross per 24 hour  Intake 0 ml  Output 500 ml  Net -500 ml   LBM:   Baseline Weight: Weight: 77.1 kg Most recent weight: Weight: 77.1 kg       Palliative Assessment/Data:PPS 20%       Patient Active Problem List   Diagnosis Date Noted  . Acute duodenitis 03/08/2019  . Adnexal mass 03/08/2019  . Acute pancreatitis 03/08/2019  . Syncope 03/08/2019  . Palliative care encounter   . On amiodarone  therapy 02/25/2018  . Acute renal failure ([place]) 01/10/2018  . Chronic anxiety 01/10/2018  . Metabolic encephalopathy Q000111Q  . Obesity with body mass index 30 or greater 12/27/2017  . Weakness of both lower extremities 09/27/2017  . Stage 3 chronic kidney disease ([place]) 04/22/2016  . SSS (sick sinus syndrome) ([place]) 12/01/2015  . Anxiety 02/18/2015  . Bladder cancer ([place]) 02/18/2015  . Presence of cardiac pacemaker 02/15/2015  . Coronary artery disease involving native coronary artery of native heart with angina pectoris ([place]) 02/14/2015  . Hypertensive heart disease 02/14/2015  . Hyperlipidemia 02/14/2015  . Paroxysmal atrial fibrillation ([place]) 02/14/2015  . Presence of aortocoronary bypass graft 02/14/2015  . Pure hypercholesterolemia 02/14/2015    Palliative Care Assessment & Plan   Recommendations/Plan:  DNR-as confirmed by patient/[name] (daughter)  Out of facility DNR completed   Continue current treatment plan per attending  Family working with CSW/Hospice for home equipment and arrangements with hopes patient will be d/c home tomorrow. Daughter requesting transport home. CM aware.   Dilaudid PRN for pain   PMT will continue to support and follow  Goals of Care and Additional Recommendations:  Limitations on Scope of Treatment: Full Scope Treatment, No Artificial Feeding, No Diagnostics and DNR.   Code Status:    Code Status Orders  (From admission, onward)         Start     Ordered   03/08/19 0755  Do not attempt resuscitation (DNR)  Continuous    Question Answer Comment  In the event of cardiac or respiratory ARREST Do not call a "code blue"   In the event of cardiac or respiratory ARREST Do not perform Intubation, CPR, defibrillation or ACLS   In the event of cardiac or respiratory ARREST Use medication by any route, position, wound care, and other measures to relive pain and suffering. May use oxygen, suction and manual treatment of airway obstruction  as needed for comfort.      03/08/19 0755        Code Status History  This patient has a current code status but no historical code status.   Advance Care Planning Activity      Prognosis:   POOR   Discharge Planning:  Home with Hospice  Care plan was discussed with patient, family, Dr. Waldron Labs, and RN.   Thank you for allowing the Palliative Medicine Team to assist in the care of this patient.  Total Time: 35 min.   Greater than 50%  of this time was spent counseling and coordinating care related to the above assessment and plan.  Alda Lea, AGPCNP-BC Palliative Medicine Team   Please contact Palliative Medicine Team phone at [phone] for questions and concerns.

## 2019-03-10 NOTE — Progress Notes (Signed)
PROGRESS NOTE                                                                                                                                                                                                             Patient Demographics:    Cassandra Castro, is a 85 y.o. female, DOB - [birth year], [id number]  Admit date - 03/08/2019   Admitting Physician Elwyn Reach, MD  Outpatient Primary MD for the patient is Myrlene Broker, MD  LOS - 2   Chief Complaint  Patient presents with   Loss of Consciousness       Brief Narrative    85 y.o. female with medical history significant of right ovarian cancer, bladder cancer, diabetes, hypertension, CHF, history of CVA, hypothyroidism, paroxysmal atrial fibrillation with pacemaker in place, who is a DNR and has been following with oncology within the [facility] for her adnexal tumor on the right with possible metastasis coming to the ER today with complaint of sustaining a syncopal episode.  Episode happened while she was in the bathroom.  Will she reports significant abdominal pain, her work-up significant for groove pancreatitis, presents of liver mets, and the second right ovarian tumor size, work-up was significant for acute diverticulitis, with surrounding duodenitis, patient with significant amount of abdominal pain, extremely frail, palliative medicine consulted, and decision has been to transition to hospice.   Subjective:    [name] today still complaining of abdominal pain.  Still reports no bowel movement.   Assessment  & Plan :    Principal Problem:   Acute pancreatitis Active Problems:   Anxiety   Coronary artery disease involving native coronary artery of native heart with angina pectoris (HCC)   Bladder cancer (HCC)   Hypertensive heart disease   Hyperlipidemia   Paroxysmal atrial fibrillation (HCC)   Stage 3 chronic kidney disease (HCC)   Acute duodenitis  Adnexal mass   Syncope   Palliative care encounter    Acute pancreatitis:  - Appears to be groove pancreatitis. -Patient significantly trending down, 2100 on admission, currently at 125, she will be advanced to clear liquid diet, plan to discharged home tomorrow with hospice, I will start her on PRN morphine liquid patient for hospice discharge. -Continue with IV fluids for now.  Acute duodenitis:  -Most likely related to adjacent acute  pancreatitis, continue with IV Primaxin(given presence of pancreatitis)   Right ovarian cancer:  -Patient is currently followed at [facility] oncology, plan was for liver mass biopsy on 9/23, history of renal cancer, unclear primary malignancy, ovarian versus recurrent renal. - Patient with significant abdominal pain, appears multifactorial, in the setting of pancreatitis, and her malignancy, very sensitive to narcotics, had to lower her Dilaudid dose, palliative medicine input greatly appreciated.  Syncope -Possibly vasovagal, versus dehydration  Hypocalcemia -Repleted  Coronary artery disease: Stable at baseline.  So far enzymes negative.  Hypertensive heart disease: Continue supportive care.  Chronic kidney disease stage III: Continue with home regimen.  Hypothermia: Resolved  Goals of care: Palliative care consult greatly appreciated, goals of care were discussed with the daughter, given possible metastatic malignancy, with extremely frail bedbound patient, decision has been made to proceed with hospice, to continue with current medical management in the hospital with no escalation of care, and she will be discharged tomorrow with hospice services.   Code Status : DNR  Family Communication  : Discussed with daughter via phone 9/15  Disposition Plan  : Home with hospice tomorrow  Consults  :  Palliative  Procedures  : None  DVT Prophylaxis  :  [place] heparin  Lab Results  Component Value Date   PLT 316 03/10/2019     Antibiotics  :    Anti-infectives (From admission, onward)   Start     Dose/Rate Route Frequency Ordered Stop   03/08/19 0900  ciprofloxacin (CIPRO) IVPB 400 mg    Note to Pharmacy: Cipro 400 mg IV q24h for CrCl < 30 mL/min   400 mg 200 mL/hr over 60 Minutes Intravenous Daily 03/08/19 0755     03/08/19 0800  metroNIDAZOLE (FLAGYL) IVPB 500 mg     500 mg 100 mL/hr over 60 Minutes Intravenous Every 8 hours 03/08/19 0755          Objective:   Vitals:   03/09/19 2000 03/10/19 0700 03/10/19 0937 03/10/19 1351  BP: (!) 118/57 (!) 141/63    Pulse: 98 97 96 96  Resp: 15 12 20 19   Temp: 98.6 F (37 C) 98.5 F (36.9 C)    TempSrc: Oral Oral    SpO2: 90% 97% 97% 96%  Weight:      Height:        Wt Readings from Last 3 Encounters:  03/08/19 77.1 kg  06/27/18 81.6 kg  02/25/18 81.2 kg     Intake/Output Summary (Last 24 hours) at 03/10/2019 1507 Last data filed at 03/09/2019 1832 Gross per 24 hour  Intake --  Output 500 ml  Net -500 ml     Physical Exam   Awake Alert, Oriented X , uncomfortable secondary to abdominal pain. Symmetrical Chest wall movement, Good air movement bilaterally, CTAB RRR,No Gallops,Rubs or new Murmurs, No Parasternal Heave +ve B.Sounds, Abd Soft, patient has diffuse abdominal tenderness, abd wall hernia which is reproducible . no Cyanosis, Clubbing or edema, No new Rash or bruise      Data Review:    CBC Recent Labs  Lab 03/08/19 0036 03/08/19 0614 03/08/19 0835 03/09/19 0422 03/10/19 0403  WBC 9.0 8.6 11.5* 12.2* 14.7*  HGB 11.3* 11.5* 12.3 12.7 11.6*  HCT 35.3* 37.0 37.9 39.7 35.2*  PLT 354 381 378 389 316  MCV 89.4 91.1 88.3 89.8 88.4  MCH 28.6 28.3 28.7 28.7 29.1  MCHC 32.0 31.1 32.5 32.0 33.0  RDW 15.8* 15.9* 15.9* 16.4* 16.4*  LYMPHSABS 2.3  --   --   --   --  MONOABS 0.6  --   --   --   --   EOSABS 0.1  --   --   --   --   BASOSABS 0.0  --   --   --   --     Chemistries  Recent Labs  Lab 03/08/19 0036  03/08/19 0614 03/09/19 0422 03/10/19 0403  NA 132* 132* 132* 132*  K 3.5 4.0 4.1 3.6  CL 97* 98 99 100  CO2 22 21* 21* 20*  GLUCOSE 168* 167* 164* 197*  BUN 23 23 24* 30*  CREATININE 1.60* 1.46* 1.71* 1.74*  CALCIUM 8.5* 7.9* 6.8* 6.4*  AST 24 23 19   --   ALT 15 13 11   --   ALKPHOS 75 69 59  --   BILITOT 0.6 0.4 0.6  --    ------------------------------------------------------------------------------------------------------------------ No results for input(s): CHOL, HDL, LDLCALC, TRIG, CHOLHDL, LDLDIRECT in the last 72 hours.  Lab Results  Component Value Date   HGBA1C 7.1 (H) 03/08/2019   ------------------------------------------------------------------------------------------------------------------ No results for input(s): TSH, T4TOTAL, T3FREE, THYROIDAB in the last 72 hours.  Invalid input(s): FREET3 ------------------------------------------------------------------------------------------------------------------ No results for input(s): VITAMINB12, FOLATE, FERRITIN, TIBC, IRON, RETICCTPCT in the last 72 hours.  Coagulation profile No results for input(s): INR, PROTIME in the last 168 hours.  No results for input(s): DDIMER in the last 72 hours.  Cardiac Enzymes No results for input(s): CKMB, TROPONINI, MYOGLOBIN in the last 168 hours.  Invalid input(s): CK ------------------------------------------------------------------------------------------------------------------ No results found for: BNP  Inpatient Medications  Scheduled Meds:  amiodarone  50 mg Oral Daily   heparin  5,000 Units Subcutaneous Q8H   insulin aspart  0-9 Units Subcutaneous TID WC   levothyroxine  75 mcg Oral Daily   polyethylene glycol  17 g Oral Daily   senna-docusate  3 tablet Oral BID   Continuous Infusions:  calcium gluconate     ciprofloxacin 400 mg (03/10/19 0944)   dextrose 5% lactated ringers 75 mL/hr at 03/08/19 2054   metronidazole 500 mg (03/10/19 1349)   PRN  Meds:.ALPRAZolam, bisacodyl, HYDROmorphone (DILAUDID) injection, ondansetron **OR** ondansetron (ZOFRAN) IV  Micro Results Recent Results (from the past 240 hour(s))  SARS Coronavirus 2 [facility] order, Performed in [facility] hospital lab) Nasopharyngeal Nasopharyngeal Swab     Status: None   Collection Time: 03/08/19  2:36 AM   Specimen: Nasopharyngeal Swab  Result Value Ref Range Status   SARS Coronavirus 2 NEGATIVE NEGATIVE Final    Comment: (NOTE) If result is NEGATIVE SARS-CoV-2 target nucleic acids are NOT DETECTED. The SARS-CoV-2 RNA is generally detectable in upper and lower  respiratory specimens during the acute phase of infection. The lowest  concentration of SARS-CoV-2 viral copies this assay can detect is 250  copies / mL. A negative result does not preclude SARS-CoV-2 infection  and should not be used as the sole basis for treatment or other  patient management decisions.  A negative result may occur with  improper specimen collection / handling, submission of specimen other  than nasopharyngeal swab, presence of viral mutation(s) within the  areas targeted by this assay, and inadequate number of viral copies  (<250 copies / mL). A negative result must be combined with clinical  observations, patient history, and epidemiological information. If result is POSITIVE SARS-CoV-2 target nucleic acids are DETECTED. The SARS-CoV-2 RNA is generally detectable in upper and lower  respiratory specimens dur ing the acute phase of infection.  Positive  results are indicative of active infection with SARS-CoV-2.  Clinical  correlation with patient history and other diagnostic information is  necessary to determine patient infection status.  Positive results do  not rule out bacterial infection or co-infection with other viruses. If result is PRESUMPTIVE POSTIVE SARS-CoV-2 nucleic acids MAY BE PRESENT.   A presumptive positive result was obtained on the submitted specimen  and  confirmed on repeat testing.  While 2019 novel coronavirus  (SARS-CoV-2) nucleic acids may be present in the submitted sample  additional confirmatory testing may be necessary for epidemiological  and / or clinical management purposes  to differentiate between  SARS-CoV-2 and other Sarbecovirus currently known to infect humans.  If clinically indicated additional testing with an alternate test  methodology [phone]) is advised. The SARS-CoV-2 RNA is generally  detectable in upper and lower respiratory sp ecimens during the acute  phase of infection. The expected result is Negative. Fact Sheet for Patients:  StrictlyIdeas.no Fact Sheet for Healthcare Providers: BankingDealers.co.za This test is not yet approved or cleared by the [place] FDA and has been authorized for detection and/or diagnosis of SARS-CoV-2 by FDA under an Emergency Use Authorization (EUA).  This EUA will remain in effect (meaning this test can be used) for the duration of the COVID-19 declaration under Section 564(b)(1) of the Act, 21 U.S.C. section 360bbb-3(b)(1), unless the authorization is terminated or revoked sooner. Performed at [facility], [place] [address], [place], [place] [address]   Urine culture     Status: Abnormal   Collection Time: 03/08/19  4:47 AM   Specimen: Urine, Random  Result Value Ref Range Status   Specimen Description URINE, RANDOM  Final   Special Requests NONE  Final   Culture (A)  Final    <10,000 COLONIES/mL INSIGNIFICANT GROWTH Performed at [facility], [place] [address], [place], [place] [address]    Report Status 03/09/2019 FINAL  Final  Culture, blood (Routine X 2) w Reflex to ID Panel     Status: None (Preliminary result)   Collection Time: 03/08/19  6:10 AM   Specimen: BLOOD RIGHT HAND  Result Value Ref Range Status   Specimen Description BLOOD RIGHT HAND  Final   Special Requests Blood Culture adequate volume   Final   Culture   Final    NO GROWTH 2 DAYS Performed at [facility], [place] [address], [place], [place] [address]    Report Status PENDING  Incomplete  Culture, blood (Routine X 2) w Reflex to ID Panel     Status: None (Preliminary result)   Collection Time: 03/08/19  6:35 AM   Specimen: BLOOD LEFT HAND  Result Value Ref Range Status   Specimen Description BLOOD LEFT HAND  Final   Special Requests   Final    BOTTLES DRAWN AEROBIC AND ANAEROBIC Blood Culture results may not be optimal due to an inadequate volume of blood received in culture bottles   Culture   Final    NO GROWTH 2 DAYS Performed at [facility], [place] [address], [place], [place] [address]    Report Status PENDING  Incomplete    Radiology Reports Ct Abdomen Pelvis Wo Contrast  Result Date: 03/08/2019 CLINICAL DATA:  Abdominal distension, syncope during bowel movements EXAM: CT ABDOMEN AND PELVIS WITHOUT CONTRAST TECHNIQUE: Multidetector CT imaging of the abdomen and pelvis was performed following the standard protocol without IV contrast. COMPARISON:  CT 08/18/2018 (no report); CT 12/27/2017 FINDINGS: Lower chest: Postsurgical changes from prior CABG with partial visualization of the  sternotomy wires. Multiple mediastinal surgical clips and extensive atherosclerotic calcification of the native vessels. Cardiac pacer/defibrillator leads terminate in right atrium and cardiac apex. No pericardial effusion. Lung bases demonstrate bandlike areas of scarring and/or atelectasis or more dependent ground-glass likely reflecting additional atelectatic change. Hepatobiliary: Multitude hypoattenuating lesions throughout the liver predominantly in the posterior right lobe with the largest lesion measuring 3.9 x 4.0 x 4.7 cm is present in the posterior right lobe liver (segment VI) with a small amount of adjacent intermediate attenuation (24 HU) subcapsular fluid. Additional hypoattenuating lesions are present in segment 4.  Dependently layering hyperattenuating material is seen at the gallbladder neck. No calcified intraductal gallstones. No pericholecystic inflammation. Pancreas: There is a small amount of edematous thickening of the pancreatic head and inflammation within pancreaticoduodenal groove. No pancreatic ductal dilatation. Partial fatty replacement of the tail the pancreas. Extensive vascular calcifications are noted. Spleen: Extensive splenic vascular calcification. No concerning splenic lesions. Adrenals/Urinary Tract: Normal adrenal glands. Postsurgical changes from prior right nephrectomy. No abnormal soft tissue in the nephrectomy bed. Left kidney appears slightly atrophic. No concerning left renal lesions. No urolithiasis or hydronephrosis. Slightly distorted contour of the urinary bladder is similar to comparison radiographs. No abnormal bladder wall thickening. Stomach/Bowel: Distal esophagus is unremarkable. The stomach is free of abnormality. There is circumferential thickening of the second and third portions of the duodenum with adjacent periduodenal stranding and free fluid, particularly within the pancreaticoduodenal groove. No extraluminal gas is identified. The appendix is not identified. No pericecal inflammation. Portion of the transverse colon is protruding into a ventral hernia sac with a 6.8 by 4.0 cm fascial defect. There is irregular circumferential mural thickening near the rectosigmoid junction. Additional circumferential rectal wall thickening is present as well with some adjacent stranding. Vascular/Lymphatic: Atherosclerotic plaque within the normal caliber aorta. Reactive adenopathy in the upper abdomen. Reproductive: Anteverted uterus. Large heterogeneously enhancing lesion appears to arise within the right adnexa, intimate with the terminus of the gonadal veins but also without discernible fat plane from the anterolateral uterus. Lesion is measuring up to 10.6 x 9.4 x 12.9 cm in size with  heterogeneous internal attenuation and some coarse calcification. This is enlarged from comparison CT in February of 2020 and new since July of 2019. Additional cystic and calcified foci are present in the left adnexa as well. Other: Large bowel containing hernia anteriorly, as detailed above. Musculoskeletal: No acute osseous abnormality or suspicious osseous lesion. Multilevel degenerative changes are present in the imaged portions of the spine. IMPRESSION: 1. Circumferential thickening of the second and third portions of the duodenum with adjacent periduodenal stranding and free fluid, with some fluid in the pancreaticoduodenal groove and mild edematous thickening of the pancreatic head. Finding could reflect a duodenitis secondary to peptic ulcer disease with reactive pancreatic features versus a groove pancreatitis. Correlation with lipase is recommended. 2. Large 12.9 cm heterogeneous lesion felt to most likely arise from the right adnexa but intimate with the lateral aspect of the uterine fundus as well. Markedly enlarged from recent comparison studies. Additional cystic and solid lesion is present in the left adnexa. Findings are concerning adnexal malignancy. Could further assess with dedicated pelvic ultrasound if clinically warranted. 3. Irregular mural thickening of the rectosigmoid junction, concerning for a malignant lesion. Recommend correlation with direct visualization. More circumferential thickening and adjacent inflammation of the distal rectum could reflect a superimposed proctitis. 4. Heterogeneous but predominantly hypoattenuating lesion measuring 3.9 x 4.0 x 4.7 cm in the posterior right lobe liver (segment  VI) with a small amount of adjacent intermediate attenuation (24 HU) subcapsular fluid. Findings are concerning for hepatic metastatic disease. 5. Large ventral hernia containing a portion of the transverse colon. No evidence of mechanical obstruction or vascular compromise. 6. Aortic  Atherosclerosis (ICD10-I70.0). These results were called by telephone at the time of interpretation on 03/08/2019 at 2:57 am to provider [facility] , who verbally acknowledged these results. Electronically Signed   By: Lovena Le M.D.   On: 03/08/2019 02:57   Dg Chest Portable 1 View  Result Date: 03/08/2019 CLINICAL DATA:  Syncope. EXAM: PORTABLE CHEST 1 VIEW COMPARISON:  Radiograph 01/27/2019. chest CTA 01/05/2019 FINDINGS: Low lung volumes. 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 sternotomy and CABG. Blunting of the left costophrenic angle with associated peripheral opacities. Vascular congestion without definite edema. No pneumothorax. No acute osseous abnormalities are seen. Chronic deformity of the right proximal humerus. IMPRESSION: Left basilar opacities similar to prior exams, favor chronic atelectasis/scarring. Low lung volumes with vascular congestion. Electronically Signed   By: Keith Rake M.D.   On: 03/08/2019 01:35     Phillips Climes M.D on 03/10/2019 at 3:07 PM  Between 7am to 7pm - Pager - [phone]  After 7pm go to [email or web address] - password [facility]  [facility] -  Office  [phone]

## 2019-03-11 LAB — GLUCOSE, CAPILLARY: Glucose-Capillary: 193 mg/dL — ABNORMAL HIGH (ref 70–99)

## 2019-03-11 MED ORDER — BISACODYL 10 MG RE SUPP: 10.0000 mg | Freq: Every day | RECTAL | 0 refills | Status: AC | PRN

## 2019-03-11 MED ORDER — HYDROMORPHONE HCL 1 MG/ML IJ SOLN
0.2500 mg | INTRAMUSCULAR | Status: DC | PRN
  Administered 2019-03-11: 0.25 mg via INTRAVENOUS
  Filled 2019-03-11: qty 1

## 2019-03-11 MED ORDER — POLYETHYLENE GLYCOL 3350 17 G PO PACK: 17.0000 g | PACK | Freq: Every day | ORAL | 0 refills | Status: AC

## 2019-03-11 MED ORDER — MORPHINE SULFATE (CONCENTRATE) 10 MG /0.5 ML PO SOLN: 5.0000 mg | ORAL | 0 refills | Status: DC | PRN

## 2019-03-11 MED ORDER — MORPHINE SULFATE (CONCENTRATE) 10 MG /0.5 ML PO SOLN: 5.0000 mg | ORAL | 0 refills | Status: AC | PRN

## 2019-03-11 MED ORDER — ONDANSETRON HCL 4 MG PO TABS: 4.0000 mg | ORAL_TABLET | Freq: Three times a day (TID) | ORAL | 0 refills | Status: AC | PRN

## 2019-03-11 MED ORDER — LIP MEDEX EX OINT
TOPICAL_OINTMENT | CUTANEOUS | Status: DC | PRN
  Administered 2019-03-11: 07:00:00 via TOPICAL
  Filled 2019-03-11: qty 7

## 2019-03-11 MED ORDER — ALPRAZOLAM 0.5 MG PO TABS: 0.5000 mg | ORAL_TABLET | Freq: Three times a day (TID) | ORAL | 0 refills | Status: AC | PRN

## 2019-03-11 NOTE — Discharge Summary (Signed)
Cassandra Castro, is a 85 y.o. female  DOB [birth year]  MRN [id number].  Admission date:  03/08/2019  Admitting Physician  Elwyn Reach, MD  Discharge Date:  03/11/2019   Primary MD  Myrlene Broker, MD  Recommendations for primary care physician for things to follow:  - Management per hospice, patient to remain on clear liquid diet, and advance as tolerated.  We will need good bowel regimen given she is bedbound, and on narcotics.  CODE STATUS DNR/hospice   Admission Diagnosis  Syncope and collapse [R55] Adnexal mass [N94.89] Other acute pancreatitis without infection or necrosis [K85.80] Acute pancreatitis [K85.90]   Discharge Diagnosis  Syncope and collapse [R55] Adnexal mass [N94.89] Other acute pancreatitis without infection or necrosis [K85.80] Acute pancreatitis [K85.90]    Principal Problem:   Acute pancreatitis Active Problems:   Anxiety   Coronary artery disease involving native coronary artery of native heart with angina pectoris (HCC)   Bladder cancer (HCC)   Hypertensive heart disease   Hyperlipidemia   Paroxysmal atrial fibrillation (HCC)   Stage 3 chronic kidney disease ([place])   Acute duodenitis   Adnexal mass   Syncope   Palliative care encounter      Past Medical History:  Diagnosis Date   Anxiety 02/18/2015   Atherosclerotic heart disease of native coronary artery without angina pectoris 02/14/2015   Bladder cancer ([place]) 02/18/2015   Blood transfusion without reported diagnosis    Cataract    CHF (congestive heart failure) ([place])    Diabetes mellitus without complication ([place])    Essential hypertension 02/14/2015   Hyperlipidemia 02/14/2015   Myocardial infarction [facility])    Osteoporosis    Paroxysmal atrial fibrillation ([place]) 02/14/2015   Presence of aortocoronary bypass graft 02/14/2015   Presence of cardiac pacemaker 02/15/2015   SSS (sick sinus syndrome)  ([place]) 12/01/2015   Stage 3 chronic kidney disease ([place]) 04/22/2016   Stroke ([place])    Syncope 02/2019   Thyroid disease    TIA (transient ischemic attack)     Past Surgical History:  Procedure Laterality Date   BACK SURGERY     BLADDER SURGERY     CAROTID ENDARTERECTOMY     CORONARY ARTERY BYPASS GRAFT     EYE SURGERY     INSERT / REPLACE / REMOVE PACEMAKER     IR RETROGRADE PYELOGRAM     NEPHROURETERECTOMY     TRANSURETHRAL RESECTION OF BLADDER         History of present illness and  Hospital Course:     Kindly see H&P for history of present illness and admission details, please review complete Labs, Consult reports and Test reports for all details in brief  HPI  from the history and physical done on the day of admission 03/08/2019  HPI: Cassandra Castro is a 85 y.o. female with medical history significant of right ovarian cancer, bladder cancer, diabetes, hypertension, CHF, history of CVA, hypothyroidism, paroxysmal atrial fibrillation with pacemaker in place, who is a DNR and has been following with oncology  within the [facility] for her adnexal tumor on the right with possible metastasis coming to the ER today with complaint of sustaining a syncopal episode.  Episode happened while she was in the bathroom.  She has been having significant diarrhea for about 2 days.  This was interspersed with some constipation.  She is also denied vomiting.  Patient has had significant worsening of her abdominal pain and symptoms.  She did have dark stools when she was having the diarrhea.  Not sure if this is bloody.  After the syncopal episode today family called EMS and she was transported to the ER.  She was slightly hypoxic at the beginning.  She has cardiac history as indicated but no chest pain no palpitations and they have been stable.  In the ER work-up was done including CT abdomen pelvis that showed significant worsening of her masses.  Also evidence of pancreatitis  and duodenitis.  She has markedly elevated lipase as well probably from the duodenitis and pancreatitis.  Not alcoholic.  Suspected this to be due to intra-abdominal tumor.  She is being admitted for treatment and supportive care..  ED Course: Temperature 96.7 blood pressure 154/69 pulse 82 respiratory rate of 18 oxygen sat 99% on room air.  White count is 9.0 hemoglobin 11.3 platelets 354.  Sodium 132 potassium 3.5 chloride 97 CO2 22 creatinine 1.60 calcium 8.5 glucose 168 and lactic acid 2.5.  Chest x-ray showed no acute findings.  She has scarring and atelectasis that is not new.  CT abdomen pelvis showed circumferential thickening of the second and third portion of the duodenum with periduodenal stranding and free fluid.  Also involving thickening of the pancreatic head indicating duodenitis secondary to peptic ulcer disease with reactive pancreatic features versus a group pancreatitis.  She has large 12.9 cm lesion at the right adnexa where she has known history of ovarian cancer.  Patient is in significant pain at this point with multiple findings including rectosigmoid junction concerning for malignant lesion.  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 Episode happened while she was in the bathroom.  Will she reports significant abdominal pain, her work-up significant for groove pancreatitis, presents of liver mets, and the worsening right ovarian tumor size, work-up was significant for acute pancreatitis, with surrounding duodenitis,  patient with significant amount of abdominal pain, extremely frail, with high possibility of static malignant tumor, palliative medicine consulted, and decision has been to transition to hospice.   Acute pancreatitis: - Appears to be groovepancreatitis.  She was managed with n.p.o., IV fluids, IV pain and nausea medication, her lipase significantly trending down, but she continues to have significant abdominal pain, most likely multifactorial in the setting of her renal mass, pancreatitis and duodenitis, patient started on clear liquid diet, but overall she has a poor appetite, she will be discharged today to hospice services, with liquid morphine for pain management, clear liquid diet to be advanced if she tolerates.  Acute duodenitis: -Most likely related to adjacent acute pancreatitis, was treated with IV antibiotics during hospital stay   Right ovarian tumors, with  evidence of metastasis: -Patient is currently followed at [facility] oncology, plan was for liver mass biopsy on 9/23, history of urothelial cancer, unclear primary malignancy, ovarian versus recurrent urothelial. -Patient with significant abdominal pain, appears multifactorial, in the setting of pancreatitis, and her malignancy, very sensitive to narcotics, had to lower her Dilaudid dose, palliative medicine input greatly appreciated.  Her dose has been adjusted, she is currently on ER and liquid morphine.  Syncope -Possibly vasovagal, versus dehydration  Hypocalcemia -Repleted  Coronary artery disease  Hypertensive heart disease  Chronic kidney disease stage III  Hypothermia: Resolved  Goals of care: Palliative care consult greatly appreciated, goals of care were discussed with the daughter, given possible metastatic malignancy, with extremely frail, bedbound patient, decision has been made to proceed with hospice, hospice has been arranged for today .   Discharge Condition:   Hospice   Follow UP  [facility] up.   Specialty: Home Health Services Why: referral made for home hospice services Contact information: [place] [id number] [phone]             Discharge Instructions  and  Discharge Medications    Discharge Instructions    Discharge instructions   Complete by: As directed    Management per hospice, patient to remain on clear liquid diet, and advance as tolerated.  We will need good bowel regimen given she is bedbound, and on narcotics.     Allergies as of 03/11/2019      Reactions   Amoxicillin Nausea And Vomiting   Amoxicillin-pot Clavulanate Other (See Comments)   Atorvastatin Other (See Comments)   Other reaction(s): Myalgias (intolerance) Bones ache Bones ache   Clavulanic Acid Nausea And Vomiting   Hydrocodone Nausea And Vomiting   Codeine Nausea Only   Latex Rash   Promethazine Nausea Only   Sulfa Antibiotics Nausea Only   Other reaction(s): Dizziness (intolerance)   Tape Rash   Tramadol Nausea And Vomiting, Nausea Only      Medication List    STOP taking these medications   Cartia XT 120 MG 24 hr capsule Generic drug: diltiazem   clopidogrel 75 MG tablet Commonly known as: PLAVIX   furosemide 40 MG tablet Commonly known as: LASIX   gabapentin 400 MG capsule Commonly known as: NEURONTIN   metFORMIN 500 MG tablet Commonly known as: GLUCOPHAGE   Multi-Vitamins Tabs   potassium chloride SA 20 MEQ tablet Commonly known as: K-DUR   rosuvastatin 20 MG tablet Commonly known as: CRESTOR     TAKE these medications   acetaminophen 325 MG tablet Commonly known as: TYLENOL Take 650 mg by mouth every 6 (six) hours as needed for mild pain.   albuterol 108 (90 Base) MCG/ACT inhaler Commonly known as: VENTOLIN HFA Inhale 2 puffs into the lungs every 4 (four) hours as needed.   ALPRAZolam 0.5 MG tablet Commonly known as: XANAX Take 1 tablet  (0.5 mg total) by mouth 3 (three) times daily as needed for anxiety. Hospice patient What changed: additional instructions   amiodarone 100 MG tablet Commonly known as: PACERONE Take 0.5 tablets by mouth daily.   amitriptyline 50 MG tablet Commonly known as: ELAVIL Take 50 mg by mouth at bedtime.   bisacodyl 10 MG suppository Commonly known as: DULCOLAX Place 1 suppository (10 mg total) rectally daily as needed for moderate constipation.   levothyroxine 75 MCG tablet Commonly known as: SYNTHROID Take 1 tablet by mouth daily.  morphine CONCENTRATE 10 mg / 0.5 ml concentrated solution Take 0.25 mLs (5 mg total) by mouth every 3 (three) hours as needed for severe pain.   ondansetron 4 MG tablet Commonly known as: ZOFRAN Take 1 tablet (4 mg total) by mouth every 8 (eight) hours as needed. What changed: reasons to take this   polyethylene glycol 17 g packet Commonly known as: MIRALAX / GLYCOLAX Take 17 g by mouth daily.         Diet and Activity recommendation: See Discharge Instructions above   Consults obtained - Palliative   Major procedures and Radiology Reports - PLEASE review detailed and final reports for all details, in brief -    Ct Abdomen Pelvis Wo Contrast  Result Date: 03/08/2019 CLINICAL DATA:  Abdominal distension, syncope during bowel movements EXAM: CT ABDOMEN AND PELVIS WITHOUT CONTRAST TECHNIQUE: Multidetector CT imaging of the abdomen and pelvis was performed following the standard protocol without IV contrast. COMPARISON:  CT 08/18/2018 (no report); CT 12/27/2017 FINDINGS: Lower chest: Postsurgical changes from prior CABG with partial visualization of the sternotomy wires. Multiple mediastinal surgical clips and extensive atherosclerotic calcification of the native vessels. Cardiac pacer/defibrillator leads terminate in right atrium and cardiac apex. No pericardial effusion. Lung bases demonstrate bandlike areas of scarring and/or atelectasis or more  dependent ground-glass likely reflecting additional atelectatic change. Hepatobiliary: Multitude hypoattenuating lesions throughout the liver predominantly in the posterior right lobe with the largest lesion measuring 3.9 x 4.0 x 4.7 cm is present in the posterior right lobe liver (segment VI) with a small amount of adjacent intermediate attenuation (24 HU) subcapsular fluid. Additional hypoattenuating lesions are present in segment 4. Dependently layering hyperattenuating material is seen at the gallbladder neck. No calcified intraductal gallstones. No pericholecystic inflammation. Pancreas: There is a small amount of edematous thickening of the pancreatic head and inflammation within pancreaticoduodenal groove. No pancreatic ductal dilatation. Partial fatty replacement of the tail the pancreas. Extensive vascular calcifications are noted. Spleen: Extensive splenic vascular calcification. No concerning splenic lesions. Adrenals/Urinary Tract: Normal adrenal glands. Postsurgical changes from prior right nephrectomy. No abnormal soft tissue in the nephrectomy bed. Left kidney appears slightly atrophic. No concerning left renal lesions. No urolithiasis or hydronephrosis. Slightly distorted contour of the urinary bladder is similar to comparison radiographs. No abnormal bladder wall thickening. Stomach/Bowel: Distal esophagus is unremarkable. The stomach is free of abnormality. There is circumferential thickening of the second and third portions of the duodenum with adjacent periduodenal stranding and free fluid, particularly within the pancreaticoduodenal groove. No extraluminal gas is identified. The appendix is not identified. No pericecal inflammation. Portion of the transverse colon is protruding into a ventral hernia sac with a 6.8 by 4.0 cm fascial defect. There is irregular circumferential mural thickening near the rectosigmoid junction. Additional circumferential rectal wall thickening is present as well with  some adjacent stranding. Vascular/Lymphatic: Atherosclerotic plaque within the normal caliber aorta. Reactive adenopathy in the upper abdomen. Reproductive: Anteverted uterus. Large heterogeneously enhancing lesion appears to arise within the right adnexa, intimate with the terminus of the gonadal veins but also without discernible fat plane from the anterolateral uterus. Lesion is measuring up to 10.6 x 9.4 x 12.9 cm in size with heterogeneous internal attenuation and some coarse calcification. This is enlarged from comparison CT in February of 2020 and new since July of 2019. Additional cystic and calcified foci are present in the left adnexa as well. Other: Large bowel containing hernia anteriorly, as detailed above. Musculoskeletal: No acute osseous abnormality or suspicious  osseous lesion. Multilevel degenerative changes are present in the imaged portions of the spine. IMPRESSION: 1. Circumferential thickening of the second and third portions of the duodenum with adjacent periduodenal stranding and free fluid, with some fluid in the pancreaticoduodenal groove and mild edematous thickening of the pancreatic head. Finding could reflect a duodenitis secondary to peptic ulcer disease with reactive pancreatic features versus a groove pancreatitis. Correlation with lipase is recommended. 2. Large 12.9 cm heterogeneous lesion felt to most likely arise from the right adnexa but intimate with the lateral aspect of the uterine fundus as well. Markedly enlarged from recent comparison studies. Additional cystic and solid lesion is present in the left adnexa. Findings are concerning adnexal malignancy. Could further assess with dedicated pelvic ultrasound if clinically warranted. 3. Irregular mural thickening of the rectosigmoid junction, concerning for a malignant lesion. Recommend correlation with direct visualization. More circumferential thickening and adjacent inflammation of the distal rectum could reflect a  superimposed proctitis. 4. Heterogeneous but predominantly hypoattenuating lesion measuring 3.9 x 4.0 x 4.7 cm in the posterior right lobe liver (segment VI) with a small amount of adjacent intermediate attenuation (24 HU) subcapsular fluid. Findings are concerning for hepatic metastatic disease. 5. Large ventral hernia containing a portion of the transverse colon. No evidence of mechanical obstruction or vascular compromise. 6. Aortic Atherosclerosis (ICD10-I70.0). These results were called by telephone at the time of interpretation on 03/08/2019 at 2:57 am to provider [facility] , who verbally acknowledged these results. Electronically Signed   By: Lovena Le M.D.   On: 03/08/2019 02:57   Dg Chest Portable 1 View  Result Date: 03/08/2019 CLINICAL DATA:  Syncope. EXAM: PORTABLE CHEST 1 VIEW COMPARISON:  Radiograph 01/27/2019. chest CTA 01/05/2019 FINDINGS: Low lung volumes. 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 sternotomy and CABG. Blunting of the left costophrenic angle with associated peripheral opacities. Vascular congestion without definite edema. No pneumothorax. No acute osseous abnormalities are seen. Chronic deformity of the right proximal humerus. IMPRESSION: Left basilar opacities similar to prior exams, favor chronic atelectasis/scarring. Low lung volumes with vascular congestion. Electronically Signed   By: Keith Rake M.D.   On: 03/08/2019 01:35    Micro Results     Recent Results (from the past 240 hour(s))  SARS Coronavirus 2 [facility] order, Performed in [facility] hospital lab) Nasopharyngeal Nasopharyngeal Swab     Status: None   Collection Time: 03/08/19  2:36 AM   Specimen: Nasopharyngeal Swab  Result Value Ref Range Status   SARS Coronavirus 2 NEGATIVE NEGATIVE Final    Comment: (NOTE) If result is NEGATIVE SARS-CoV-2 target nucleic acids are NOT DETECTED. The SARS-CoV-2 RNA is generally detectable in upper and lower  respiratory specimens during the  acute phase of infection. The lowest  concentration of SARS-CoV-2 viral copies this assay can detect is 250  copies / mL. A negative result does not preclude SARS-CoV-2 infection  and should not be used as the sole basis for treatment or other  patient management decisions.  A negative result may occur with  improper specimen collection / handling, submission of specimen other  than nasopharyngeal swab, presence of viral mutation(s) within the  areas targeted by this assay, and inadequate number of viral copies  (<250 copies / mL). A negative result must be combined with clinical  observations, patient history, and epidemiological information. If result is POSITIVE SARS-CoV-2 target nucleic acids are DETECTED. The SARS-CoV-2 RNA is generally detectable in upper and lower  respiratory specimens dur ing the acute  phase of infection.  Positive  results are indicative of active infection with SARS-CoV-2.  Clinical  correlation with patient history and other diagnostic information is  necessary to determine patient infection status.  Positive results do  not rule out bacterial infection or co-infection with other viruses. If result is PRESUMPTIVE POSTIVE SARS-CoV-2 nucleic acids MAY BE PRESENT.   A presumptive positive result was obtained on the submitted specimen  and confirmed on repeat testing.  While 2019 novel coronavirus  (SARS-CoV-2) nucleic acids may be present in the submitted sample  additional confirmatory testing may be necessary for epidemiological  and / or clinical management purposes  to differentiate between  SARS-CoV-2 and other Sarbecovirus currently known to infect humans.  If clinically indicated additional testing with an alternate test  methodology [id number]) is advised. The SARS-CoV-2 RNA is generally  detectable in upper and lower respiratory sp ecimens during the acute  phase of infection. The expected result is Negative. Fact Sheet for Patients:   StrictlyIdeas.no Fact Sheet for Healthcare Providers: BankingDealers.co.za This test is not yet approved or cleared by the [place] FDA and has been authorized for detection and/or diagnosis of SARS-CoV-2 by FDA under an Emergency Use Authorization (EUA).  This EUA will remain in effect (meaning this test can be used) for the duration of the COVID-19 declaration under Section 564(b)(1) of the Act, 21 U.S.C. section 360bbb-3(b)(1), unless the authorization is terminated or revoked sooner. Performed at [facility], [place] [address] [place], [place] [address]   Urine culture     Status: Abnormal   Collection Time: 03/08/19  4:47 AM   Specimen: Urine, Random  Result Value Ref Range Status   Specimen Description URINE, RANDOM  Final   Special Requests NONE  Final   Culture (A)  Final    <10,000 COLONIES/mL INSIGNIFICANT GROWTH Performed at [facility], [place] [address] [place], [place] [address]    Report Status 03/09/2019 FINAL  Final  Culture, blood (Routine X 2) w Reflex to ID Panel     Status: None (Preliminary result)   Collection Time: 03/08/19  6:10 AM   Specimen: BLOOD RIGHT HAND  Result Value Ref Range Status   Specimen Description BLOOD RIGHT HAND  Final   Special Requests Blood Culture adequate volume  Final   Culture   Final    NO GROWTH 2 DAYS Performed at [facility], [place] [address] [place], [place] [address]    Report Status PENDING  Incomplete  Culture, blood (Routine X 2) w Reflex to ID Panel     Status: None (Preliminary result)   Collection Time: 03/08/19  6:35 AM   Specimen: BLOOD LEFT HAND  Result Value Ref Range Status   Specimen Description BLOOD LEFT HAND  Final   Special Requests   Final    BOTTLES DRAWN AEROBIC AND ANAEROBIC Blood Culture results may not be optimal due to an inadequate volume of blood received in culture bottles   Culture   Final    NO GROWTH 2 DAYS Performed at  [facility], [place] [address] [place], [place] [address]    Report Status PENDING  Incomplete       Today   Subjective:   Cassandra Castro today complaining of abdominal pain, reports no bowel movement.  Objective:   Blood pressure (!) 155/62, pulse 97, temperature 98.5 F (36.9 C), temperature source Oral, resp. rate 18, height 5\' 1"  (1.549 m), weight 77.1 kg, SpO2 94 %.  Intake/Output Summary (Last 24 hours) at 03/11/2019 0929 Last data filed at 03/11/2019 0537 Gross per 24 hour  Intake 260 ml  Output 1370 ml  Net -1110 ml    Exam Awake Alert, appears to be uncomfortable Symmetrical Chest wall movement, Good air movement bilaterally, CTAB RRR,No Gallops,Rubs or new Murmurs, No Parasternal Heave +ve B.Sounds, Abd Soft, diffuse tenderness to palpation . no Cyanosis, Clubbing or edema, No new Rash or bruise  Data Review   CBC w Diff:  Lab Results  Component Value Date   WBC 14.7 (H) 03/10/2019   HGB 11.6 (L) 03/10/2019   HCT 35.2 (L) 03/10/2019   PLT 316 03/10/2019   LYMPHOPCT 26 03/08/2019   MONOPCT 6 03/08/2019   EOSPCT 1 03/08/2019   BASOPCT 0 03/08/2019    CMP:  Lab Results  Component Value Date   NA 132 (L) 03/10/2019   K 3.6 03/10/2019   CL 100 03/10/2019   CO2 20 (L) 03/10/2019   BUN 30 (H) 03/10/2019   CREATININE 1.74 (H) 03/10/2019   PROT 5.4 (L) 03/09/2019   ALBUMIN 2.6 (L) 03/09/2019   BILITOT 0.6 03/09/2019   ALKPHOS 59 03/09/2019   AST 19 03/09/2019   ALT 11 03/09/2019  .   Total Time in preparing paper work, data evaluation and todays exam - 31 minutes  Phillips Climes M.D on 03/11/2019 at 9:29 AM  [facility]   Office  [phone]

## 2019-03-11 NOTE — Progress Notes (Signed)
Pt provided info regarding transferring home. Pt happy about decision/ Pt c/o/ax4. PT states stomach hurts. Morphine administered for comfort. IV removed and intact. All paperwork ready for PTAR and discharge. Pt belongings in bag. Vital stable. TX to [facility] for tx home.  Jerald Kief, RN

## 2019-03-11 NOTE — Progress Notes (Signed)
PALLIATIVE NOTE:  Patient resting in bed, easily aroused with verbal stimuli. Complains of pain but does express that medications is helping and feeling more comfortable compared to other days. She is aware of the plan to discharge home with family and hospice. No family at the bedside.   Spoke with daughter via phone and provided updates. She is happy for her mom to be returning home and the support of hospice. She reports family are all on board and preparing to be available and supportive of patient.   All questions answered and support given. Daughter verbalized appreciation of all care and support given.   Assessment -NAD, chronically-ill appearing -diminished bilatterally -RRR -+bs x4 -awake, alert, and oriented  Plan -DNR (form completed and on chart) -At patient/family's request home with hospice support  Total Time: 25 min.   Greater than 50%  of this time was spent counseling and coordinating care related to the above assessment and plan.   Alda Lea, AGPCNP-BC Palliative Medicine Team

## 2019-03-11 NOTE — Discharge Instructions (Signed)
Management per hospice, patient to remain on clear liquid diet, and advance as tolerated.  We will need good bowel regimen given she is bedbound, and on narcotics.

## 2019-03-11 NOTE — TOC Transition Note (Signed)
Transition of Care [facility]) - CM/SW Discharge Note Marvetta Gibbons RN, BSN Transitions of Care Unit 4E- RN Case Manager [phone]   Patient Details  Name: Cassandra Castro MRN: [id number] Date of Birth: [birth year]  Transition of Care [facility]) CM/SW Contact:  [name], RN Phone Number: 03/11/2019, 11:41 AM   Clinical Narrative:    Pt for transition home today with home hospice- have spoken with [name] at [facility] and confirmed that they will make nursing visit in the home this afternoon. [employer] to deliver home 02 this am per Hospice arrangements- spoke with daughter [name] who states [employer] to be at the home with 02 at 12 noon. CM has called [facility] for transport home at 12 noon. GOLD DNR has been signed and is on chart along with transport paperwork. Bedside RN updated.    Final next level of care: Home w Hospice Care Barriers to Discharge: No Barriers Identified   Patient Goals and CMS Choice Patient states their goals for this hospitalization and ongoing recovery are:: "to be home and comfortable and be with family"(per daughter [name]) CMS Medicare.gov Compare Post Acute Care list provided to:: Patient Represenative (must comment)(Daughter- [name]) Choice offered to / list presented to : Adult Children  Discharge Placement  Home with [place].                 Name of family member notified: [name] Patient and family notified of of transfer: 03/11/19  Discharge Plan and Services   Discharge Planning Services: CM Consult Post Acute Care Choice: Hospice          DME Arranged: Oxygen DME Agency: [facility]       HH Arranged: Disease Management [place] Agency: [facility] Date [place]: 03/10/19 Time [place]: 95 Representative spoke with at [place]: [name] ([place]) Interventions     Readmission Risk Interventions Readmission Risk Prevention Plan 03/11/2019  Transportation  Screening Complete  PCP or Specialist Appt within 5-7 Days Complete  Home Care Screening Complete  Medication Review (RN CM) Complete  Some recent data might be hidden

## 2019-03-11 NOTE — Progress Notes (Addendum)
Patient has been getting Dilaudid 0.25 mg I.V. every 3 hours for severe abdo. Pain. She is moaning and calling my name. She goes to sleep for  1.5-2 hours and then awaken with pain. Tried P.O. Morphine 2.6 mg for abdo pain now. Spoke with Tylene Fantasia N.P. and she is aware.Pain meds need to be adjusted for comfort.

## 2019-03-13 LAB — CULTURE, BLOOD (ROUTINE X 2)
Culture: NO GROWTH
Culture: NO GROWTH
Special Requests: ADEQUATE

## 2019-03-26 DEATH — deceased

## 2020-04-08 IMAGING — DX DG CHEST 1V PORT
1 series · 1 of 1 positions shown · non-contrast
Comparison: Radiograph 01/27/2019. chest CTA 01/05/2019

CLINICAL DATA: Syncope.

EXAM:
PORTABLE CHEST 1 VIEW

[chest ap]
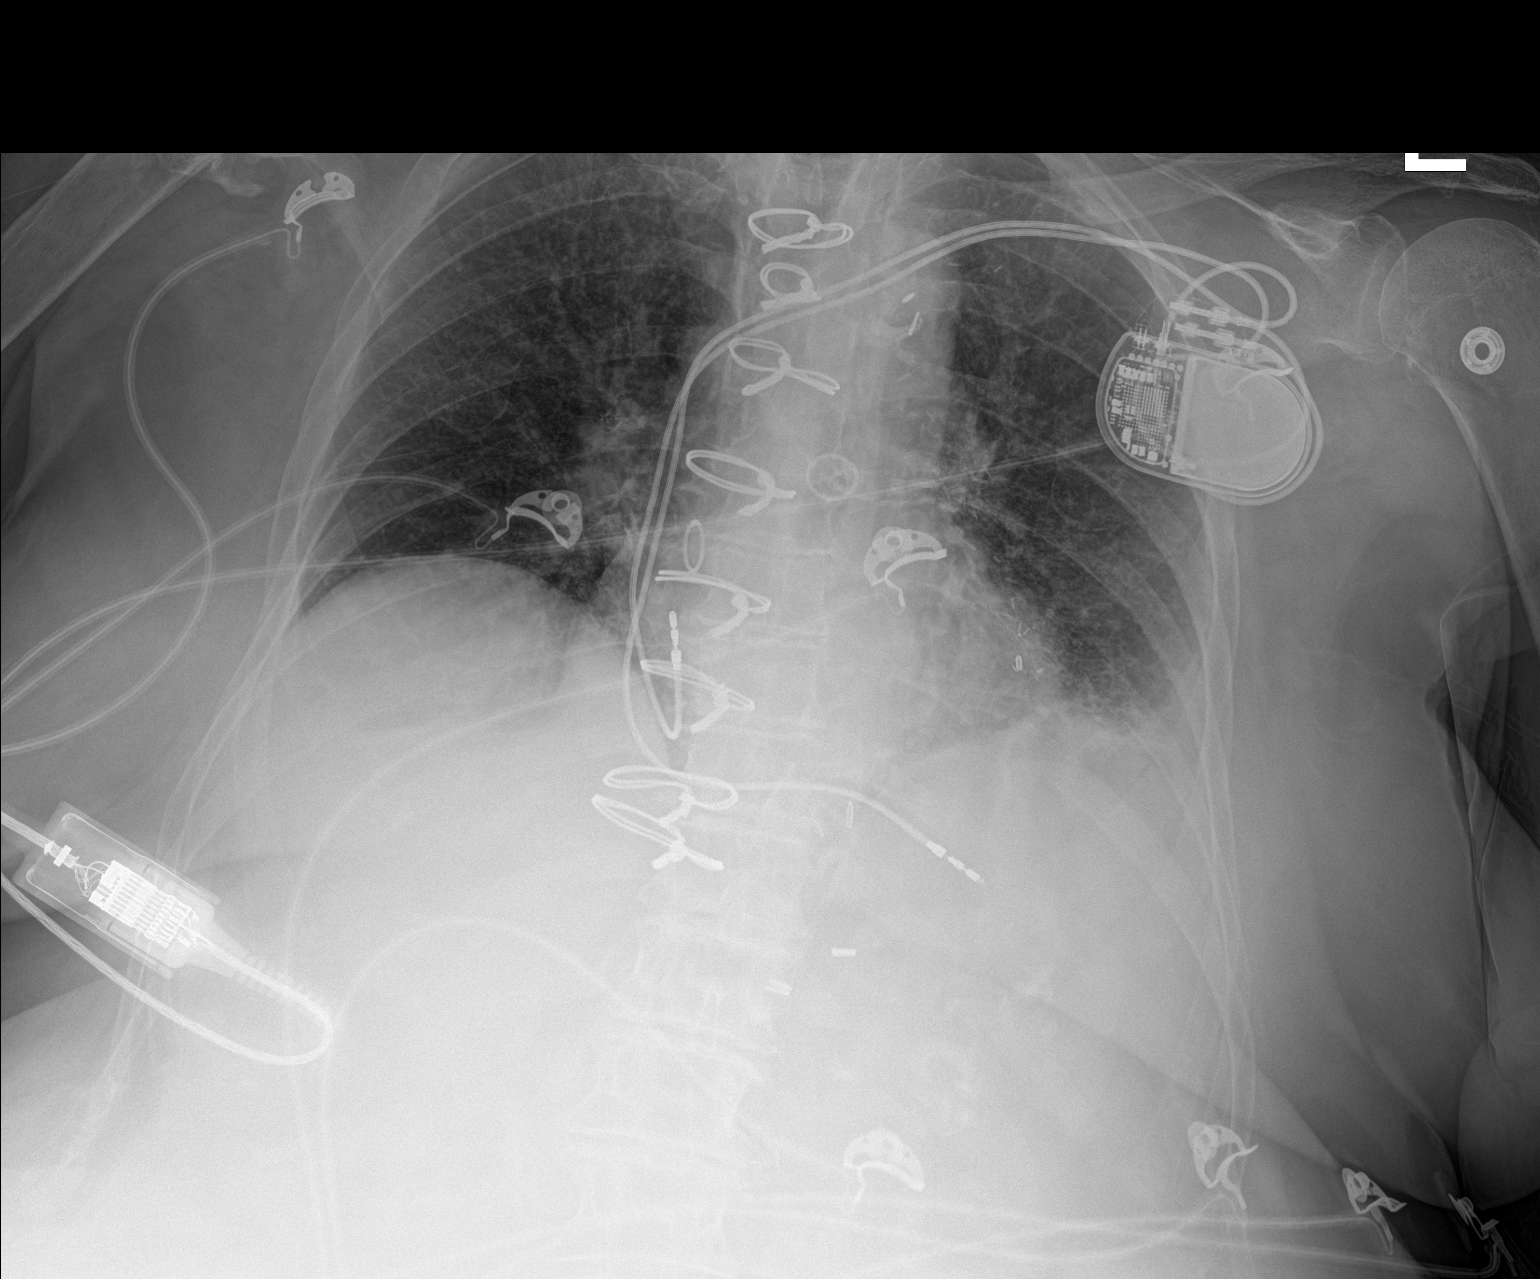

[1 of 1 positions shown; findings below may reference images not displayed]

FINDINGS: Low lung volumes. Left-sided pacemaker remains in place. Post median
sternotomy and CABG. Blunting of the left costophrenic angle with
associated peripheral opacities. Vascular congestion without
definite edema. No pneumothorax. No acute osseous abnormalities are
seen. Chronic deformity of the right proximal humerus.
IMPRESSION: Left basilar opacities similar to prior exams, favor chronic
atelectasis/scarring.

Low lung volumes with vascular congestion.

## 2021-03-25 DEATH — deceased
